# Patient Record
Sex: Male | Born: 1965 | Race: Asian | Hispanic: No | Marital: Married | Smoking: Never smoker
Health system: Southern US, Community
[De-identification: ages and names within clinical notes are randomized; demographics above are authoritative.]

## PROBLEM LIST (undated history)

## (undated) DIAGNOSIS — F32A Depression, unspecified: Secondary | ICD-10-CM

## (undated) DIAGNOSIS — I10 Essential (primary) hypertension: Secondary | ICD-10-CM

## (undated) HISTORY — DX: Essential (primary) hypertension: I10

## (undated) HISTORY — DX: Depression, unspecified: F32.A

---

## 2010-07-26 ENCOUNTER — Emergency Department (HOSPITAL_COMMUNITY)
Admission: EM | Admit: 2010-07-26 | Discharge: 2010-07-26 | Disposition: A | Discharge status: Home / Self Care | Payer: Medicaid Other | Attending: Emergency Medicine | Admitting: Emergency Medicine

## 2010-07-26 DIAGNOSIS — R51 Headache: Secondary | ICD-10-CM | POA: Insufficient documentation

## 2010-07-26 DIAGNOSIS — R509 Fever, unspecified: Secondary | ICD-10-CM | POA: Insufficient documentation

## 2010-07-26 DIAGNOSIS — J039 Acute tonsillitis, unspecified: Secondary | ICD-10-CM | POA: Insufficient documentation

## 2010-07-26 LAB — URINE MICROSCOPIC-ADD ON

## 2010-07-26 LAB — URINALYSIS, ROUTINE W REFLEX MICROSCOPIC
Bilirubin Urine: NEGATIVE
Bilirubin Urine: NEGATIVE
Glucose, UA: NEGATIVE mg/dL
Ketones, ur: NEGATIVE mg/dL
Nitrite: NEGATIVE
Specific Gravity, Urine: 1.007 (ref 1.005–1.030)
Specific Gravity, Urine: 1.006 (ref 1.005–1.030)
pH: 7 (ref 5.0–8.0)
pH: 6.5 (ref 5.0–8.0)

## 2012-10-06 ENCOUNTER — Encounter (HOSPITAL_COMMUNITY): Payer: Self-pay | Admitting: *Deleted

## 2012-10-06 ENCOUNTER — Emergency Department (HOSPITAL_COMMUNITY)
Admission: EM | Admit: 2012-10-06 | Discharge: 2012-10-06 | Disposition: A | Discharge status: Home / Self Care | Payer: 59 | Attending: Emergency Medicine | Admitting: Emergency Medicine

## 2012-10-06 DIAGNOSIS — R5381 Other malaise: Secondary | ICD-10-CM | POA: Insufficient documentation

## 2012-10-06 DIAGNOSIS — R51 Headache: Secondary | ICD-10-CM | POA: Insufficient documentation

## 2012-10-06 DIAGNOSIS — R519 Headache, unspecified: Secondary | ICD-10-CM

## 2012-10-06 DIAGNOSIS — R5383 Other fatigue: Secondary | ICD-10-CM | POA: Insufficient documentation

## 2012-10-06 DIAGNOSIS — R509 Fever, unspecified: Secondary | ICD-10-CM | POA: Insufficient documentation

## 2012-10-06 MED ORDER — ACETAMINOPHEN 500 MG PO TABS
1000.0000 mg | ORAL_TABLET | Freq: Once | ORAL | Status: AC
  Administered 2012-10-06: 1000 mg via ORAL
  Filled 2012-10-06: qty 2

## 2012-10-06 NOTE — ED Notes (Addendum)
Pt states he was at work and HA started slowly. States he also felt hot with fever. No meds taken. HA remains. No vomiting. States he feels very tired. Pt acting appropriately. Laughing at times, but appears to be in pain

## 2012-10-06 NOTE — ED Provider Notes (Signed)
CSN: [id number]     Arrival date & time 10/06/12  1725 History     First MD Initiated Contact with Patient 10/06/12 2126     Chief Complaint  Patient presents with  . Headache  . Fever   (Consider location/radiation/quality/duration/timing/severity/associated sxs/prior Treatment) HPI Comments: Patient reports he began having a headache around 1pm today while he was at work.  States the pain gradually got worse over a period of 5 hours.  Pain located across forehead, throbbing.  States he felt hot and his body felt weak while he was having the headache.  The headache is almost gone now and he has taken no medication.  Denies fevers, chills, body aches, recent illness.  Denies dizziness, lightheadedness, visual changes, focal neurological deficits.   Patient is a 47 y.o. male presenting with headaches and fever. The history is provided by the patient. The history is limited by a language barrier. A language interpreter was used.  Headache Associated symptoms: no abdominal pain, no cough, no diarrhea, no dizziness, no fever, no nausea, no numbness and no vomiting   Fever Associated symptoms: headaches   Associated symptoms: no chest pain, no chills, no cough, no diarrhea, no nausea and no vomiting     History reviewed. No pertinent past medical history. History reviewed. No pertinent past surgical history. History reviewed. No pertinent family history. History  Substance Use Topics  . Smoking status: Never Smoker   . Smokeless tobacco: Not on file  . Alcohol Use: Not on file    Review of Systems  Constitutional: Negative for fever and chills.  Respiratory: Negative for cough and shortness of breath.   Cardiovascular: Negative for chest pain.  Gastrointestinal: Negative for nausea, vomiting, abdominal pain and diarrhea.  Neurological: Positive for headaches. Negative for dizziness, light-headedness and numbness.    Allergies  Review of patient's allergies indicates no known  allergies.  Home Medications  No current outpatient prescriptions on file. BP 129/93  Pulse 68  Temp(Src) 97.5 F (36.4 C) (Oral)  Resp 20  SpO2 100% Physical Exam  Nursing note and vitals reviewed. Constitutional: He appears well-developed and well-nourished. No distress.  HENT:  Head: Normocephalic and atraumatic.  Neck: Normal range of motion. Neck supple.  Cardiovascular: Normal rate and regular rhythm.   Pulmonary/Chest: Effort normal and breath sounds normal. No respiratory distress. He has no wheezes. He has no rales.  Abdominal: Soft. He exhibits no distension and no mass. There is no tenderness. There is no rebound and no guarding.  Neurological: He is alert. He has normal strength. No cranial nerve deficit or sensory deficit. He exhibits normal muscle tone. Gait normal. GCS eye subscore is 4. GCS verbal subscore is 5. GCS motor subscore is 6.  CN II-XII intact, EOMs intact, no pronator drift, grip strengths equal bilaterally; strength 5/5 in all extremities, sensation intact in all extremities; finger to nose, heel to shin, rapid alternating movements normal; gait is normal.     Skin: He is not diaphoretic.    ED Course   Procedures (including critical care time)  Labs Reviewed - No data to display No results found.  1. Headache     MDM  Pt with frontal headache today that was gradual in onset over 5 hours, no neuro deficits. Pain has improved without any medications, pt stating it was "almost gone" when I first saw him.  No meningeal signs.  No red flags for headache.  Pt given tylenol here, advised to take same or ibuprofen at  home.  Has PCP follow up with [facility].  Discussed all results with patient.  Pt given return precautions.  Pt verbalizes understanding and agrees with plan.       Trixie Dredge, PA-C 10/06/12 2307

## 2012-10-09 NOTE — ED Provider Notes (Signed)
Medical screening examination/treatment/procedure(s) were performed by non-physician practitioner and as supervising physician I was immediately available for consultation/collaboration.   Lyanne Co, MD 10/09/12 [phone]

## 2013-04-16 ENCOUNTER — Encounter (HOSPITAL_COMMUNITY): Payer: Self-pay | Admitting: Emergency Medicine

## 2013-04-16 ENCOUNTER — Emergency Department (HOSPITAL_COMMUNITY)
Admission: EM | Admit: 2013-04-16 | Discharge: 2013-04-16 | Disposition: A | Discharge status: Home / Self Care | Payer: 59 | Attending: Emergency Medicine | Admitting: Emergency Medicine

## 2013-04-16 DIAGNOSIS — L309 Dermatitis, unspecified: Secondary | ICD-10-CM

## 2013-04-16 DIAGNOSIS — IMO0002 Reserved for concepts with insufficient information to code with codable children: Secondary | ICD-10-CM | POA: Insufficient documentation

## 2013-04-16 DIAGNOSIS — L259 Unspecified contact dermatitis, unspecified cause: Secondary | ICD-10-CM | POA: Insufficient documentation

## 2013-04-16 MED ORDER — HYDROCORTISONE 1 % EX CREA
1.0000 | TOPICAL_CREAM | Freq: Two times a day (BID) | CUTANEOUS | Status: DC
Start: 2013-04-16 — End: 2014-02-11

## 2013-04-16 MED ORDER — PREDNISONE 20 MG PO TABS: 40.0000 mg | ORAL_TABLET | Freq: Every day | ORAL | Status: DC

## 2013-04-16 NOTE — Discharge Instructions (Signed)
????????? ?? ?????? ??????????. ????? ???? ????? ?? ???? ???????? ?? ?? ???? ???  Use the cream prescribed. Call a dermatologist if symptoms persist or worsen.  ??????? ??? atopic ????? ?????? ??????? , ???? ??????? ???????????? ?? ???? ????? ?? ? ?? ?? ??? ???? ? ?????? , scaly ???? ?????? ? ???? ???? ????? ????? ? ??????? ???? ???? ??????? ?????????? ???? ????? ? ????? ????? ?????? ??? ????? ? ??????? ?????????? ???????????? ?? ???????? ?????? ???? ?????? ???? ??????????? ??????? outgrow , ?? ?? ????? ?????? ??????? ???? ? ??????????? ??????? ???????? ???? ???? ? , ?? ?? ?????? ????? ??????? ? ??????? ??? ?????? ????? ??????? , ?????? , ?? , ?? ???? ????? ?? ?? ?????? ?????? ? ? ??????? ????? ??? ? ?????? ??? - ?? ???? ??? ???? : ???? ??? ?????? ????? ????????? ?? ?????? ? ???? ? ????? ? ????? ????? , scaly ???? ? ??? , ????? ???? ? Itchiness ? ?? ???? ???? ??? ??? ???? ? ???? ????? ??? ???? ? ????? ??????? ?? ????? ?????????? ????? ? ???????? ?????? ?? ?????? ????? ?? ????? ??????? ???? ??? ??????, ?? ????? ?????????? ????? ? ???? ?????? ????????? ???? ??????? ?? ????? ????? ?????? ??? ????: ????? ????????? ? scratching? ????? ???? ????????? ????? ?????-the-??????? antihistamines ?????? ?????????? ????? ???? ??? ????????? ?? ?? ??? ?? ????? ??? ?????? ?? ????? ???? ????????? ????? ?????-the-??????? ?????????? ????? ?????? ?????????? Scratching ????? Scratching ???? ? ??? ????? ??????? ?? ??? ???? scratching ???? ???? ?? ?? ????? ?? ???? ??????? (impetigo) ??? ????? ?????? ???? ??? ????? ??? moisturized ???? ???????? ?? ??????? ?? ??? ? ???????? ????? ??? ??????? ????????? ???? ?????? ???? ?????? ????? ? ???? ?????? ???? ???? ?????????? ????? (??????) ????????? ?? ?????? ???? ???? ????? ?????? ???? ?? ???? ????????? ??????? ???? ?????????? ????? ?????? ??? ???? ???????? ????? ????????? ???????? ??????? ?????? ????????? ????? ????? ?????-the-??????? ?? ?????? ???? ???? ????? ?????????  ???????? ??????? ??? ???? ???? ???? ?? ???? ?????? ??????????? ?????? (???? ???) ???? ?? ????? ?? ???? ????? (5 ?????) ??????????? ????? ?? ???? ???? cleansers ?????? ?????????? ?? unscented ????????? ????? ????? ???? nonperfumed ????? ??? ???? ??????????? ?? ????? ? ????? ????? ????? ??? ????? ?????? ?? ??????? ?? ????? ?? ????? ???, ???? ??? ??? ????? ? ??, ???????? ???? ?? ???????????? ??? ????? ?? ??? ?? ?????????? ??? ????????? ?? ??????? ?? ??? ? ???????? ????? ??? ??????? ??????, ???? ????? ?? unscented ????????? ????????? ????? ??????????? ??????? ??? ???????? ?? ??? ???? ??? ???? ??? ????? ?? mittens ?????? ?????????? ??? ????? ???? ?? ???? ??????????? ????? ?????? ????? ????? ???? ??????, ???????? ?????? ??????? ??? ?? ????? ????? ???? ?? ???? ?????? ??? ???? ???? ???? ????? ????? ???? (???? Simplex) ????????? ????? ??????? ???????? ??? ?? ?? ?????? ???? ??????? ??? ????? ?????? ???? ??? ??????????: ??????? ????? ???????? ??? ?????????? ??????? ???? ???? ????? ?? ????? ???? ??? ??? 1 ????? ????? ????? ?? ????? ???? ??????? ?? ???? ?? ??? ?????? scabs ??????????? ????? ?? ????? ?? ????? ????? ???? ? ?????? ??????? ??? ?? ???? ?????-???? ??   Eczema Eczema, also called atopic dermatitis, is a skin disorder that causes inflammation of the skin. It causes a red rash and dry, scaly skin. The skin becomes very itchy. Eczema is generally worse during the cooler winter months and often improves with the warmth of summer. Eczema usually starts showing signs in infancy. Some children outgrow eczema, but it may last through adulthood.  CAUSES  The exact cause  of eczema is not known, but it appears to run in families. People with eczema often have a family history of eczema, allergies, asthma, or hay fever. Eczema is not contagious. Flare-ups of the condition may be caused by:   Contact with something you are sensitive or allergic to.   Stress. SIGNS AND SYMPTOMS  Dry, scaly skin.   Red,  itchy rash.   Itchiness. This may occur before the skin rash and may be very intense.  DIAGNOSIS  The diagnosis of eczema is usually made based on symptoms and medical history. TREATMENT  Eczema cannot be cured, but symptoms usually can be controlled with treatment and other strategies. A treatment plan might include:  Controlling the itching and scratching.   Use over-the-counter antihistamines as directed for itching. This is especially useful at night when the itching tends to be worse.   Use over-the-counter steroid creams as directed for itching.   Avoid scratching. Scratching makes the rash and itching worse. It may also result in a skin infection (impetigo) due to a break in the skin caused by scratching.   Keeping the skin well moisturized with creams every day. This will seal in moisture and help prevent dryness. Lotions that contain alcohol and water should be avoided because they can dry the skin.   Limiting exposure to things that you are sensitive or allergic to (allergens).   Recognizing situations that cause stress.   Developing a plan to manage stress.  HOME CARE INSTRUCTIONS   Only take over-the-counter or prescription medicines as directed by your health care provider.   Do not use anything on the skin without checking with your health care provider.   Keep baths or showers short (5 minutes) in warm (not hot) water. Use mild cleansers for bathing. These should be unscented. You may add nonperfumed bath oil to the bath water. It is best to avoid soap and bubble bath.   Immediately after a bath or shower, when the skin is still damp, apply a moisturizing ointment to the entire body. This ointment should be a petroleum ointment. This will seal in moisture and help prevent dryness. The thicker the ointment, the better. These should be unscented.   Keep fingernails cut short. Children with eczema may need to wear soft gloves or mittens at night after  applying an ointment.   Dress in clothes made of cotton or cotton blends. Dress lightly, because heat increases itching.   A child with eczema should stay away from anyone with fever blisters or cold sores. The virus that causes fever blisters (herpes simplex) can cause a serious skin infection in children with eczema. SEEK MEDICAL CARE IF:   Your itching interferes with sleep.   Your rash gets worse or is not better within 1 week after starting treatment.   You see pus or soft yellow scabs in the rash area.   You have a fever.   You have a rash flare-up after contact with someone who has fever blisters.  Document Released: 02/07/2000 Document Revised: 11/30/2012 Document Reviewed: 09/12/2012 [facility] Patient Information 2014 [place], [place].

## 2013-04-16 NOTE — ED Provider Notes (Signed)
CSN: [id number]     Arrival date & time 04/16/13  1355 History  This chart was scribed for non-physician practitioner, Antonietta Breach, PA-C working with Blanchard Kelch, MD by Frederich Balding, ED scribe. This patient was seen in room TR05C/TR05C and the patient's care was started at 2:23 PM.   Chief Complaint  Patient presents with  . Allergic Reaction   The history is provided by the patient. A language interpreter was used.   HPI Comments: Cody Ross is a 48 y.o. male who presents to the Emergency Department complaining of an allergic reaction that started one week ago. He has had an itchy rash that started on his face and moved down to his neck. Pt is unsure what triggered the reaction. He has taken benadryl with little relief. Pt denies any new foods, medications, soaps or detergents. Denies fever, inability to swallowing, drooling, trouble breathing, SOB, emesis. Pt denies recent travel outside of the country.   History reviewed. No pertinent past medical history. History reviewed. No pertinent past surgical history. No family history on file. History  Substance Use Topics  . Smoking status: Never Smoker   . Smokeless tobacco: Not on file  . Alcohol Use: Not on file    Review of Systems  Constitutional: Negative for fever.  HENT: Negative for drooling and trouble swallowing.   Respiratory: Negative for shortness of breath.   Gastrointestinal: Negative for vomiting.  Skin: Positive for rash.  All other systems reviewed and are negative.   Allergies  Review of patient's allergies indicates no known allergies.  Home Medications   Current Outpatient Rx  Name  Route  Sig  Dispense  Refill  . hydrocortisone cream 1 %   Topical   Apply 1 application topically 2 (two) times daily.   15 g   1   . predniSONE (DELTASONE) 20 MG tablet   Oral   Take 2 tablets (40 mg total) by mouth daily.   10 tablet   0     BP 137/95  Pulse 76  Temp(Src) 97.7 F (36.5 C) (Oral)  Resp  16  SpO2 99%  Physical Exam  Nursing note and vitals reviewed. Constitutional: He is oriented to person, place, and time. He appears well-developed and well-nourished. No distress.  HENT:  Head: Normocephalic and atraumatic.  Mouth/Throat: Oropharynx is clear and moist. No oropharyngeal exudate.  Oropharynx clear. Patient tolerating secretions without difficulty or drooling. No oral lesions.  Eyes: Conjunctivae and EOM are normal. No scleral icterus.  Neck: Normal range of motion. Neck supple.  No stridor.  Cardiovascular: Normal rate, regular rhythm and normal heart sounds.   Pulmonary/Chest: Effort normal and breath sounds normal. No stridor. No respiratory distress. He has no wheezes. He has no rales.  Musculoskeletal: Normal range of motion.  Neurological: He is alert and oriented to person, place, and time.  GCS 15. Speech is goal oriented. Patient moves extremities without ataxia.  Skin: Skin is warm and dry. Rash noted. He is not diaphoretic. No erythema. No pallor.  Raised, pruritic, erythematous macular rash appreciated to bilateral cheeks and forehead as well as patient's neck. Rash is dry without scaling or plaques. No skin peeling, vesicles, pustules, or bullae. No weeping or drainage.  Psychiatric: He has a normal mood and affect. His behavior is normal.    ED Course  Procedures (including critical care time)  DIAGNOSTIC STUDIES: Oxygen Saturation is 99% on RA, normal by my interpretation.    COORDINATION OF CARE: 2:36 PM-Discussed treatment  plan which includes a steroid cream with pt at bedside and pt agreed to plan. Advised pt to follow up with dermatology.   Labs Review Labs Reviewed - No data to display Imaging Review No results found.  EKG Interpretation   None       MDM   Final diagnoses:  Eczema   Uncomplicated eczematous rash. Patient well and nontoxic appearing, hemodynamically stable, and afebrile. No angioedema appreciated. Patient speaks in  full goal oriented sentences. He tolerates his secretions without difficulty or drooling. Rash non-concerning for SJS, erythema multiforme major, and erythema multiforme minor; patient denies the use of antibiotics or new medications prior to rash onset. Have consulted with pharmacist who recommends hydrocortisone used topically on face. Will also prescribe oral steroids for symptoms. Have advised frequent moisturization. Dermatology referral provided as well as return precautions. These have all been discussed with the patient using a language line interpreter. Patient agreeable to plan with no unaddressed concerns.  I personally performed the services described in this documentation, which was scribed in my presence. The recorded information has been reviewed and is accurate.   Filed Vitals:   04/16/13 1402  BP: 137/95  Pulse: 76  Temp: 97.7 F (36.5 C)  TempSrc: Oral  Resp: 16  SpO2: 99%     Antonietta Breach, PA-C 04/16/13 1608

## 2013-04-16 NOTE — ED Notes (Signed)
Friend in room translating for pt.  Speaks Nepali.

## 2013-04-16 NOTE — ED Notes (Signed)
Pt. Has a rash around face and neck. Denies any problem swallowing.

## 2013-04-16 NOTE — ED Notes (Signed)
Translator stated, allergic reaction for one week on the face.

## 2013-04-17 NOTE — ED Provider Notes (Signed)
Medical screening examination/treatment/procedure(s) were conducted as a shared visit with non-physician practitioner(s) and myself.  I personally evaluated the patient during the encounter.  EKG Interpretation   None      I interviewed and examined the patient. Lungs are CTAB. Cardiac exam wnl. Abdomen soft.  Mild erythematous macular/papular rash on cheeks, foreheads, right lateral neck. Pt notes it is pruritic. Does not appear infectious in nature, no weeping, drainage, warmth. Doubt malar rash assoc w/ lupus given it is pruritic. Likely eczematous vs contact dermatitis.    Junius ArgyleForrest S Seleena Reimers, MD 04/17/13 1209

## 2013-11-09 ENCOUNTER — Encounter (HOSPITAL_COMMUNITY): Payer: Self-pay | Admitting: Emergency Medicine

## 2013-11-09 ENCOUNTER — Emergency Department (HOSPITAL_COMMUNITY)
Admission: EM | Admit: 2013-11-09 | Discharge: 2013-11-09 | Disposition: A | Discharge status: Home / Self Care | Payer: 59 | Attending: Emergency Medicine | Admitting: Emergency Medicine

## 2013-11-09 ENCOUNTER — Emergency Department (HOSPITAL_COMMUNITY): Payer: 59

## 2013-11-09 DIAGNOSIS — IMO0002 Reserved for concepts with insufficient information to code with codable children: Secondary | ICD-10-CM | POA: Diagnosis not present

## 2013-11-09 DIAGNOSIS — Y9289 Other specified places as the place of occurrence of the external cause: Secondary | ICD-10-CM | POA: Diagnosis not present

## 2013-11-09 DIAGNOSIS — Y990 Civilian activity done for income or pay: Secondary | ICD-10-CM | POA: Diagnosis not present

## 2013-11-09 DIAGNOSIS — S99919A Unspecified injury of unspecified ankle, initial encounter: Principal | ICD-10-CM

## 2013-11-09 DIAGNOSIS — Y9389 Activity, other specified: Secondary | ICD-10-CM | POA: Diagnosis not present

## 2013-11-09 DIAGNOSIS — S8990XA Unspecified injury of unspecified lower leg, initial encounter: Secondary | ICD-10-CM | POA: Diagnosis not present

## 2013-11-09 DIAGNOSIS — S99929A Unspecified injury of unspecified foot, initial encounter: Principal | ICD-10-CM

## 2013-11-09 DIAGNOSIS — M79672 Pain in left foot: Secondary | ICD-10-CM

## 2013-11-09 MED ORDER — OXYCODONE-ACETAMINOPHEN 5-325 MG PO TABS
2.0000 | ORAL_TABLET | Freq: Once | ORAL | Status: AC
  Administered 2013-11-09: 2 via ORAL
  Filled 2013-11-09: qty 2

## 2013-11-09 MED ORDER — INDOMETHACIN 25 MG PO CAPS: 25.0000 mg | ORAL_CAPSULE | Freq: Three times a day (TID) | ORAL | Status: DC | PRN

## 2013-11-09 MED ORDER — HYDROCODONE-ACETAMINOPHEN 5-325 MG PO TABS: 1.0000 | ORAL_TABLET | Freq: Four times a day (QID) | ORAL | Status: DC | PRN

## 2013-11-09 NOTE — ED Notes (Signed)
Pt. injured his left foot at work yesterday ( hit it against a caster wheel ) , presents with left foot pain with mild swelling , ambulatory.

## 2013-11-09 NOTE — Discharge Instructions (Signed)
Recommend you take indomethacin as prescribed. You may also take Norco for pain control. Ice your foot 3-4 times per day. Keep your foot elevated. Follow up with an orthopedist if symptoms persist.  RICE: Routine Care for Injuries The routine care of many injuries includes Rest, Ice, Compression, and Elevation (RICE). HOME CARE INSTRUCTIONS  Rest is needed to allow your body to heal. Routine activities can usually be resumed when comfortable. Injured tendons and bones can take up to 6 weeks to heal. Tendons are the cord-like structures that attach muscle to bone.  Ice following an injury helps keep the swelling down and reduces pain.  Put ice in a plastic bag.  Place a towel between your skin and the bag.  Leave the ice on for 15-20 minutes, 3-4 times a day, or as directed by your health care provider. Do this while awake, for the first 24 to 48 hours. After that, continue as directed by your caregiver.  Compression helps keep swelling down. It also gives support and helps with discomfort. If an elastic bandage has been applied, it should be removed and reapplied every 3 to 4 hours. It should not be applied tightly, but firmly enough to keep swelling down. Watch fingers or toes for swelling, bluish discoloration, coldness, numbness, or excessive pain. If any of these problems occur, remove the bandage and reapply loosely. Contact your caregiver if these problems continue.  Elevation helps reduce swelling and decreases pain. With extremities, such as the arms, hands, legs, and feet, the injured area should be placed near or above the level of the heart, if possible. SEEK IMMEDIATE MEDICAL CARE IF:  You have persistent pain and swelling.  You develop redness, numbness, or unexpected weakness.  Your symptoms are getting worse rather than improving after several days. These symptoms may indicate that further evaluation or further X-rays are needed. Sometimes, X-rays may not show a small broken  bone (fracture) until 1 week or 10 days later. Make a follow-up appointment with your caregiver. Ask when your X-ray results will be ready. Make sure you get your X-ray results. Document Released: 05/24/2000 Document Revised: 02/14/2013 Document Reviewed: 07/11/2010 [facility] Patient Information 2015 [place], [place]. This information is not intended to replace advice given to you by your health care provider. Make sure you discuss any questions you have with your health care provider.

## 2013-11-10 NOTE — ED Provider Notes (Signed)
CSN: [id number]     Arrival date & time 11/09/13  1901 History   First MD Initiated Contact with Patient 11/09/13 2055     Chief Complaint  Patient presents with  . Foot Injury    (Consider location/radiation/quality/duration/timing/severity/associated sxs/prior Treatment) Patient is a 48 y.o. male presenting with foot injury. The history is provided by the patient and a relative. A language interpreter was used (son translating at bedside).  Foot Injury Location:  Foot Time since incident:  1 day Injury: yes   Mechanism of injury comment:  Foot hit against a caster wheel Foot location:  L foot Pain details:    Quality:  Aching and throbbing   Radiates to:  Does not radiate   Severity:  Moderate   Onset quality:  Sudden   Duration:  1 day   Timing:  Constant   Progression:  Unchanged Chronicity:  New Dislocation: no   Prior injury to area:  No Relieved by:  None tried Worsened by:  Bearing weight (and palpation) Ineffective treatments:  None tried Associated symptoms: swelling (mild)   Associated symptoms: no decreased ROM, no fever, no muscle weakness and no numbness   Risk factors: no known bone disorder     History reviewed. No pertinent past medical history. History reviewed. No pertinent past surgical history. No family history on file. History  Substance Use Topics  . Smoking status: Never Smoker   . Smokeless tobacco: Not on file  . Alcohol Use: No    Review of Systems  Constitutional: Negative for fever.  Musculoskeletal: Positive for arthralgias and joint swelling.  Skin: Negative for pallor.  Neurological: Negative for weakness and numbness.  All other systems reviewed and are negative.   Allergies  Review of patient's allergies indicates no known allergies.  Home Medications   Prior to Admission medications   Medication Sig Start Date End Date Taking? Authorizing Provider  HYDROcodone-acetaminophen (NORCO/VICODIN) 5-325 MG per tablet Take 1-2  tablets by mouth every 6 (six) hours as needed for moderate pain or severe pain. 11/09/13   Antony Madura, PA-C  hydrocortisone cream 1 % Apply 1 application topically 2 (two) times daily. 04/16/13   Antony Madura, PA-C  indomethacin (INDOCIN) 25 MG capsule Take 1 capsule (25 mg total) by mouth 3 (three) times daily as needed. 11/09/13   Antony Madura, PA-C  predniSONE (DELTASONE) 20 MG tablet Take 2 tablets (40 mg total) by mouth daily. 04/16/13   Antony Madura, PA-C   BP 120/82  Pulse 80  Temp(Src) 99.8 F (37.7 C) (Oral)  Resp 16  Ht  (1.626 m)  Wt 137 lb (62.143 kg)  BMI 23.50 kg/m2  SpO2 99%  Physical Exam  Nursing note and vitals reviewed. Constitutional: He is oriented to person, place, and time. He appears well-developed and well-nourished. No distress.  HENT:  Head: Normocephalic and atraumatic.  Eyes: Conjunctivae and EOM are normal. No scleral icterus.  Neck: Normal range of motion.  Cardiovascular: Normal rate, regular rhythm and intact distal pulses.   DP and PT pulses 2+ in left lower extremity.  Pulmonary/Chest: Effort normal. No respiratory distress.  Musculoskeletal: Normal range of motion. He exhibits tenderness.       Left foot: He exhibits tenderness, bony tenderness and swelling. He exhibits normal range of motion, no crepitus and no deformity.       Feet:  Neurological: He is alert and oriented to person, place, and time. He exhibits normal muscle tone. Coordination normal.  Sensation to light  touch intact. Patient able to wiggle all toes.  Skin: Skin is warm and dry. No rash noted. He is not diaphoretic. No erythema. No pallor.  Psychiatric: He has a normal mood and affect. His behavior is normal.    ED Course  Procedures (including critical care time) Labs Review Labs Reviewed - No data to display  Imaging Review Dg Foot Complete Left  11/09/2013   CLINICAL DATA:  Left foot view. Pain is worse with bearing weight. A cart hit the foot.  EXAM: LEFT FOOT -  COMPLETE 3+ VIEW  COMPARISON:  None.  FINDINGS: There is no evidence of fracture or dislocation. There is no evidence of arthropathy or other focal bone abnormality. Soft tissues are unremarkable.  IMPRESSION: Negative.   Electronically Signed   By: Rosalie Gums M.D.   On: 11/09/2013 21:42     EKG Interpretation None      MDM   Final diagnoses:  Left foot pain    47 year old male presents to the emergency department for left foot pain after sustaining an injury yesterday. Patient hit his foot against a caster wheel. No medications taken prior to arrival. Patient neurovascularly intact on exam. Tenderness is localized to the first MTP joint of the left foot. There is mild associated swelling. No crepitus, deformity, or effusion. Imaging today is negative for fracture or dislocation. No bony deformity. Patient given postop shoe for comfort. He is stable for discharge with prescription for NSAIDs and Norco for breakthrough pain. Return precautions discussed and provided. Patient agreeable to plan with no unaddressed concerns.   Filed Vitals:   11/09/13 1918 11/09/13 2210  BP: 120/84 120/82  Pulse: 82 80  Temp: 99.8 F (37.7 C)   TempSrc: Oral   Resp: 14 16  Height:  (1.626 m)   Weight: 137 lb (62.143 kg)   SpO2: 98% 99%      Antony Madura, PA-C 11/10/13 [phone]

## 2013-11-11 NOTE — ED Provider Notes (Signed)
Medical screening examination/treatment/procedure(s) were performed by non-physician practitioner and as supervising physician I was immediately available for consultation/collaboration.   EKG Interpretation None        Jahid Weida N Diana Davenport, DO 11/11/13 1709 

## 2014-02-11 ENCOUNTER — Encounter (HOSPITAL_COMMUNITY): Payer: Self-pay | Admitting: Nurse Practitioner

## 2014-02-11 ENCOUNTER — Emergency Department (HOSPITAL_COMMUNITY)
Admission: EM | Admit: 2014-02-11 | Discharge: 2014-02-11 | Disposition: A | Discharge status: Home / Self Care | Payer: 59 | Attending: Emergency Medicine | Admitting: Emergency Medicine

## 2014-02-11 DIAGNOSIS — R21 Rash and other nonspecific skin eruption: Secondary | ICD-10-CM | POA: Insufficient documentation

## 2014-02-11 DIAGNOSIS — Z79899 Other long term (current) drug therapy: Secondary | ICD-10-CM | POA: Diagnosis not present

## 2014-02-11 DIAGNOSIS — Z7952 Long term (current) use of systemic steroids: Secondary | ICD-10-CM | POA: Insufficient documentation

## 2014-02-11 MED ORDER — DIPHENHYDRAMINE HCL 25 MG PO TABS: 25.0000 mg | ORAL_TABLET | Freq: Four times a day (QID) | ORAL | Status: DC

## 2014-02-11 MED ORDER — HYDROCORTISONE 1 % EX CREA: 1.0000 "application " | TOPICAL_CREAM | Freq: Two times a day (BID) | CUTANEOUS | Status: DC

## 2014-02-11 NOTE — ED Notes (Signed)
PA at bedside.

## 2014-02-11 NOTE — Discharge Instructions (Signed)
Please take your hydrocortisone cream as directed. He may take Benadryl for her allergies and any itching he may experience. Please follow-up with primary care for further evaluation and management of your symptoms. Return to ED for worsening symptoms, fevers, redness, skin peeling   Emergency Department Resource Guide 1) Find a Doctor and Pay Out of Pocket Although you won't have to find out who is covered by your insurance plan, it is a good idea to ask around and get recommendations. You will then need to call the office and see if the doctor you have chosen will accept you as a new patient and what types of options they offer for patients who are self-pay. Some doctors offer discounts or will set up payment plans for their patients who do not have insurance, but you will need to ask so you aren't surprised when you get to your appointment.  2) Contact Your Local Health Department Not all health departments have doctors that can see patients for sick visits, but many do, so it is worth a call to see if yours does. If you don't know where your local health department is, you can check in your phone book. The CDC also has a tool to help you locate your state's health department, and many state websites also have listings of all of their local health departments.  3) Find a Walk-in Clinic If your illness is not likely to be very severe or complicated, you may want to try a walk in clinic. These are popping up all over the country in pharmacies, drugstores, and shopping centers. They're usually staffed by nurse practitioners or physician assistants that have been trained to treat common illnesses and complaints. They're usually fairly quick and inexpensive. However, if you have serious medical issues or chronic medical problems, these are probably not your best option.  No Primary Care Doctor: - Call Health Connect at  [phone] - they can help you locate a primary care doctor that  accepts your  insurance, provides certain services, etc. - Physician Referral Service- [phone]  Chronic Pain Problems: Organization         Address  Phone   Notes  [facility]  [phone] Patients need to be referred by their primary care doctor.   Medication Assistance: Organization         Address  Phone   Notes  [facility] [address] [place], [place] [address] [phone]) [phone] --Must be a resident of [place] [facility] -- Must have NO insurance coverage whatsoever (no Medicaid/ Medicare, etc.) -- The pt. MUST have a primary care doctor that directs their care regularly and follows them in the community   MedAssist  [phone]   [employer]  [phone]    Agencies that provide inexpensive medical care: Organization         Address  Phone   Notes  [facility]  [phone]   [facility]    [phone]   [facility] [address] [place], [place] [address] [phone]   [facility] [address], [place] [phone]) [phone]   [facility]anned [facility]    [phone](336) [phone]   [facility]    [phone]   [facility]  [address], [place] Phone:  [phone]) [phone], Fax:  [phone] Hours of Operation:  9 am - 6 pm, M-F.  Also  accepts Medicaid/Medicare and self-pay.  [facility] for [place], [address], [place] Phone: [phone], Fax: [phone]. Hours of Operation:  8:30 am - 5:30 pm, M-F.  Also accepts Medicaid and self-pay.  [facility] [place] [address] [place] Phone: [phone]   [place], [place], [place] [phone], Ext. [phone] Mondays & Thursdays: 7-9 AM.  First 15 patients are seen on a first come, first serve basis.    [place] Providers:  Organization          Address  Phone   Notes  [facility] [address], [place] [phone] Also accepts self-pay patients.  [facility] [id number] [place], [place]  [phone]   [place], [address], [place] [phone]   [facility] [address], [place] [phone]   Lucianne Lei [address], [place]   [phone] Only accepts [place] Access [place] patients after they have their name applied to their card.   Self-Pay (no insurance) in [facility]:  Organization         Address  Phone   Notes  Sickle Cell Patients, [facility] [place] [phone]   [facility] [place] [phone]   [facility] [place]  [place], [place], [place] [phone]   [facility]/Dr. Osei-Bonsu  [address] [place] or [address], [place] [phone] Phone number for both [place] and [place] locations is the same.  [facility] [address] [place] [phone]   [facility] [address], [place] or [address] [phone]   [facility] [address] [place] [phone], phone; [phone], fax Sees patients 1st and 3rd Saturday of every month.  Must not qualify for public or private insurance (i.e. Medicaid, Medicare, [place] Health Choice, Veterans' Benefits)  Household income should be no more than 200% of the poverty level The clinic cannot treat you if you are pregnant or think you are pregnant  Sexually transmitted diseases are not treated at the clinic.    Dental Care: Organization         Address  Phone  Notes  [facility] Department of [facility] [phone] Accepts children up to age 12 who are enrolled in [place] or [place]; pregnant women with a Medicaid card; and children who have applied for Medicaid or [place] Health Choice, but were declined, whose parents can pay a reduced fee at time of service.  [facility] of [facility]  [address], [place] [phone] Accepts children up to age 44 who are enrolled in [place] or [place]; pregnant women with a Medicaid card; and children who have applied for Medicaid or [place] Health Choice, but were declined, whose parents can pay a reduced fee at time of service.  [facility] Access PROGRAM  [place] [phone] Patients are seen by appointment only. Walk-ins are not accepted. [place] will see patients 60 years of age and older. Monday - Tuesday (8am-5pm)  Most Wednesdays (8:30-5pm) $30 per visit, cash only  [facility] Adult [employer] PROGRAM  [address], [facility] [phone] Patients are seen by appointment only. Walk-ins are not accepted. [place] will see patients 54 years of age and older. One Wednesday Evening (Monthly: Volunteer Based).  $30 per visit, cash only  [place]  [phone] for adults; Children under age 44, call Graduate Pediatric Dentistry at [phone]. Children aged 70-14, please call [phone] to request a pediatric application.  Dental services are provided in all areas of dental care including fillings, crowns and bridges, complete and partial dentures, implants, gum treatment, root canals, and extractions. Preventive care is also provided. Treatment is provided to both adults and children. Patients are selected via a lottery and there is often a waiting list.   [facility] [address] [place]  [phone] [email or web address]   [facility] [address] [place], [place]  [phone], Ext. [phone] Second and Fourth Thursday of each month, opens at 6:30 AM; Clinic ends at 9 AM.  Patients are seen on a first-come first-served basis, and a limited number are seen during each clinic.   [facility]  [address] Hillard Danker [place], [place] [phone]   Eligibility Requirements You must have lived in [place], [place], or [place] counties for at least the last three months.   You cannot be eligible for state or federal sponsored [employer], including [employer], [place], or [employer].   You generally cannot be eligible for healthcare insurance through your employer.    How to apply: Eligibility screenings are held every Tuesday and Wednesday afternoon from 1:00 pm until 4:00 pm. You do not need an appointment for the interview!  [facility] [address], [place], [place]   [place]  [place] Department  [place]  [phone]    Behavioral Health Resources in the Community: Intensive Outpatient Programs Organization         Address  Phone  Notes  [place]. [address], [place], [place] [phone]   [facility] Outpatient [address] [place], [place]   ADS: Alcohol & Drug Svcs [address], [place], [place]   [place] [address],  [place], [place] or [phone]   Substance Abuse Resources Organization         Address  Phone  Notes  Alcohol and Drug Services  [phone]   [place]  [phone]   [facility] [place]   Chinita Pester  [phone]   Residential & Outpatient Substance Abuse Program  [phone]   Psychological Services Organization         Address  Phone  Notes  [facility]  [place]  [phone]    [place] [address] [place] or [phone]    Mobile Crisis Teams Organization         Address  Phone  Notes  [facility], Mobile Crisis Care Unit  [phone]   [facility]ive Psychotherapeutic Services  [address] [place], [place]   [place] [address], [place] [phone]    Self-Help/Support Groups Organization         Address  Phone  Notes  [facility] of [place] - variety of support groups  [place] Call for more information  Narcotics Anonymous (NA), [facility]aring [facility] [address], [employer] [place]  2 meetings at this location   Special educational needs teacher         Address  Phone  Notes  [facility] Residential Treatment [place],    [place]  [phone]   [facility]  [address], [place] 426834, [place], [place]   [place], [place] [phone] Admissions: 8am-3pm M-F  Incentives Substance [place] [address],    [place], [place] [phone]   [facility] [address] [place], [place], [place]   [facility] [address]  [place], [place]   [facility]ight Programs - Intensive Outpatient [address] Kristeen [address], [place], [place]   [facility] ([place].,  [place], [place] [phone] or [phone]   [facility] Services (RTS) [address] [place], [place] Accepts Medicaid  [facility] [address],  [place] [phone] Substance Abuse/Addiction Treatment   [facility] Organization         Address  Phone  Notes  [facility]  [phone]   Domenic Schwab, PhD [address] Arlis Porta [place], [place]   [phone] or [phone]   [place]  [place], [place] [phone]   [facility] [address], [place], [place] [phone] Insurance/Medicaid/sponsorship through [facility] and Families [address], [address] [place]                                    [place], [place] [phone] [place] [address], [place] [phone]    Dr. Adele Schilder  [phone]   [facility] Dept. 1) [address] [place] 2) [place] 3)  [address], [place] [phone]  [phone]   [place] [phone] or [phone] (After Hours)

## 2014-02-11 NOTE — ED Notes (Signed)
hes had an itchy red rash to face and back since yesterday. Denies any new products of medications. Denies any past history of allergies. Hes tried nothing at home for the rash

## 2014-02-11 NOTE — ED Provider Notes (Signed)
CSN: [id number]     Arrival date & time 02/11/14  1625 History  This chart was scribed for non-physician practitioner, Joycie PeekBenjamin Nancyann Cotterman, PA-C working with Flint MelterElliott L Wentz, MD by Greggory StallionKayla Andersen, ED scribe. This patient was seen in room TR07C/TR07C and the patient's care was started at 5:19 PM.   Chief Complaint  Patient presents with  . Rash   The history is provided by the patient. No language interpreter was used.    HPI Comments: Cody Ross is a 48 y.o. male who presents to the Emergency Department complaining of an itchy, red rash to his face and back that started yesterday. Reports associated facial swelling where the rash is. Rash is itchy and nonpainful. He has not done anything for his symptoms and states nothing makes them better or worse. Denies new soaps, lotions, detergents, fragrances. No one at home has a similar rash. Denies known allergies. Pt states he had a similar rash last year but never followed up with a dermatologist to figure out the cause. Denies fever, eye burning, changes in vision.   History reviewed. No pertinent past medical history. History reviewed. No pertinent past surgical history. History reviewed. No pertinent family history. History  Substance Use Topics  . Smoking status: Never Smoker   . Smokeless tobacco: Not on file  . Alcohol Use: No    Review of Systems  Constitutional: Negative for fever.  HENT: Positive for facial swelling.   Skin: Positive for rash.  All other systems reviewed and are negative.  Allergies  Review of patient's allergies indicates no known allergies.  Home Medications   Prior to Admission medications   Medication Sig Start Date End Date Taking? Authorizing Provider  diphenhydrAMINE (BENADRYL) 25 MG tablet Take 1 tablet (25 mg total) by mouth every 6 (six) hours. 02/11/14   Sharlene MottsBenjamin W Lileigh Fahringer, PA-C  HYDROcodone-acetaminophen (NORCO/VICODIN) 5-325 MG per tablet Take 1-2 tablets by mouth every 6 (six) hours as needed for  moderate pain or severe pain. 11/09/13   Antony MaduraKelly Humes, PA-C  hydrocortisone cream 1 % Apply 1 application topically 2 (two) times daily. 02/11/14   Earle GellBenjamin W Naveed Humphres, PA-C  indomethacin (INDOCIN) 25 MG capsule Take 1 capsule (25 mg total) by mouth 3 (three) times daily as needed. 11/09/13   Antony MaduraKelly Humes, PA-C  predniSONE (DELTASONE) 20 MG tablet Take 2 tablets (40 mg total) by mouth daily. 04/16/13   Antony MaduraKelly Humes, PA-C   BP 134/87 mmHg  Pulse 65  Temp(Src) 98.1 F (36.7 C) (Oral)  SpO2 99%   Physical Exam  Constitutional: He is oriented to person, place, and time. He appears well-developed and well-nourished. No distress.  HENT:  Head: Normocephalic and atraumatic.  Mouth/Throat: Oropharynx is clear and moist. No oropharyngeal exudate.  Eyes: Conjunctivae and EOM are normal. Pupils are equal, round, and reactive to light. Right eye exhibits no discharge. Left eye exhibits no discharge.  Neck: Neck supple. No tracheal deviation present.  Cardiovascular: Normal rate, regular rhythm and normal heart sounds.   Pulmonary/Chest: Effort normal and breath sounds normal. No respiratory distress.  Clear to auscultation bilaterally.  Abdominal: Soft. There is no tenderness.  Musculoskeletal: Normal range of motion.  Neurological: He is alert and oriented to person, place, and time.  Skin: Skin is warm and dry.  Diffuse, mildly erythematous maculopapular rash to forehead and cheeks, and lower left lumbar area. No vesicles, drainage or weeping. Negative [place].  Psychiatric: He has a normal mood and affect. His behavior is normal.  Nursing note  and vitals reviewed.   ED Course  Procedures (including critical care time)  DIAGNOSTIC STUDIES: Oxygen Saturation is 99% on RA, normal by my interpretation.    COORDINATION OF CARE: 5:30 PM-Discussed treatment plan which includes benadryl and a topical cream with pt at bedside and pt agreed to plan. Will give pt dermatology referrals and advised him to  follow up.   Labs Review Labs Reviewed - No data to display  Imaging Review No results found.   EKG Interpretation None      MDM  Cody MayKumar Borchard is a 48 y.o. male here for evaluation of one day history of rash to face and lower back. History of present illness is given via interpreter as patient speaks [place]. Rash appears eczematous in nature. Rash is pruritic and is nonpainful Was treated for same rash last year with hydrocortisone cream which resolved rash.  Vitals stable - WNL -afebrile Pt resting comfortably in ED. PE--eczematous rash to forehead, bilateral cheeks and lower left lumbar area. Rash does not follow dermatomal pattern  DDX--low concern for SJS, TEN, NF, zoster or other acute emergent skin manifestation. Low concern for zoster ophthalmicus Will DC with hydrocortisone cream to be applied topically. Resource guide provided so patient may follow-up with primary care. Also given Benadryl for associated eye itching. Discussed f/u with PCP and return precautions, pt very amenable to plan. Pt stable, in good condition and is appropriate for discharge,   Final diagnoses:  Rash      I personally performed the services described in this documentation, which was scribed in my presence. The recorded information has been reviewed and is accurate.  Earle GellBenjamin W Randlettartner, PA-C 02/11/14 1806  Flint MelterElliott L Wentz, MD 02/11/14 [id number]

## 2015-06-01 ENCOUNTER — Emergency Department (HOSPITAL_COMMUNITY)
Admission: EM | Admit: 2015-06-01 | Discharge: 2015-06-01 | Disposition: A | Discharge status: Home / Self Care | Payer: 59 | Attending: Emergency Medicine | Admitting: Emergency Medicine

## 2015-06-01 DIAGNOSIS — R21 Rash and other nonspecific skin eruption: Secondary | ICD-10-CM | POA: Diagnosis present

## 2015-06-01 DIAGNOSIS — L509 Urticaria, unspecified: Secondary | ICD-10-CM | POA: Diagnosis not present

## 2015-06-01 LAB — CBC WITH DIFFERENTIAL/PLATELET
BASOS ABS: 0.1 10*3/uL (ref 0.0–0.1)
BASOS PCT: 1 %
EOS ABS: 0.5 10*3/uL (ref 0.0–0.7)
Eosinophils Relative: 7 %
HCT: 40.4 % (ref 39.0–52.0)
Hemoglobin: 14.2 g/dL (ref 13.0–17.0)
LYMPHS PCT: 17 %
Lymphs Abs: 1.3 10*3/uL (ref 0.7–4.0)
MCH: 29.4 pg (ref 26.0–34.0)
MCHC: 35.1 g/dL (ref 30.0–36.0)
MCV: 83.6 fL (ref 78.0–100.0)
MONO ABS: 0.7 10*3/uL (ref 0.1–1.0)
Monocytes Relative: 9 %
NEUTROS ABS: 5.1 10*3/uL (ref 1.7–7.7)
Neutrophils Relative %: 66 %
PLATELETS: 196 10*3/uL (ref 150–400)
RBC: 4.83 MIL/uL (ref 4.22–5.81)
RDW: 12.8 % (ref 11.5–15.5)
WBC: 7.7 10*3/uL (ref 4.0–10.5)

## 2015-06-01 LAB — BASIC METABOLIC PANEL
Anion gap: 9 (ref 5–15)
BUN: 7 mg/dL (ref 6–20)
CALCIUM: 8.8 mg/dL — AB (ref 8.9–10.3)
CO2: 20 mmol/L — ABNORMAL LOW (ref 22–32)
CREATININE: 0.76 mg/dL (ref 0.61–1.24)
Chloride: 111 mmol/L (ref 101–111)
Glucose, Bld: 102 mg/dL — ABNORMAL HIGH (ref 65–99)
Potassium: 3.6 mmol/L (ref 3.5–5.1)
SODIUM: 140 mmol/L (ref 135–145)

## 2015-06-01 MED ORDER — FAMOTIDINE IN NACL 20-0.9 MG/50ML-% IV SOLN
20.0000 mg | Freq: Once | INTRAVENOUS | Status: AC
  Administered 2015-06-01: 20 mg via INTRAVENOUS
  Filled 2015-06-01: qty 50

## 2015-06-01 MED ORDER — PREDNISONE 50 MG PO TABS: 50.0000 mg | ORAL_TABLET | Freq: Every day | ORAL | Status: DC

## 2015-06-01 MED ORDER — METHYLPREDNISOLONE SODIUM SUCC 125 MG IJ SOLR
125.0000 mg | Freq: Once | INTRAMUSCULAR | Status: AC
  Administered 2015-06-01: 125 mg via INTRAVENOUS
  Filled 2015-06-01: qty 2

## 2015-06-01 MED ORDER — DIPHENHYDRAMINE HCL 25 MG PO TABS: 25.0000 mg | ORAL_TABLET | Freq: Four times a day (QID) | ORAL | Status: DC

## 2015-06-01 MED ORDER — DIPHENHYDRAMINE HCL 50 MG/ML IJ SOLN
25.0000 mg | Freq: Once | INTRAMUSCULAR | Status: AC
  Administered 2015-06-01: 25 mg via INTRAVENOUS
  Filled 2015-06-01: qty 1

## 2015-06-01 NOTE — ED Notes (Signed)
Pt. Noticed rash to face starting 1 week ago. Pt. sts it has been getting worse since then. Rash/hives noted to face, neck, and abdomen. Pt. Denies any trouble breathing, but does endorse itching. Pt. Speaks nepali with son and wife speaking english.

## 2015-06-01 NOTE — ED Provider Notes (Signed)
CSN: [id number]     Arrival date & time 06/01/15  1045 History   First MD Initiated Contact with Patient 06/01/15 1059     Chief Complaint  Patient presents with  . Rash     (Consider location/radiation/quality/duration/timing/severity/associated sxs/prior Treatment) The history is provided by the patient. A language interpreter was used.  Cody Ross is a 50 y.o. male history of urticaria here presenting with rash on face, neck. Patient starts having a rash that started on his face that progressed to his neck as well as torso. He states that it is itchy. Denies any trouble breathing when any new food or shampoo or medicines. He had urticaria several years ago and was thought to have possible eczema and was improving on steroid cream. Did not take any medicines for it today.     Son translating   No past medical history on file. No past surgical history on file. No family history on file. Social History  Substance Use Topics  . Smoking status: Never Smoker   . Smokeless tobacco: Not on file  . Alcohol Use: No    Review of Systems  Skin: Positive for rash.  All other systems reviewed and are negative.     Allergies  Review of patient's allergies indicates no known allergies.  Home Medications   Prior to Admission medications   Medication Sig Start Date End Date Taking? Authorizing Provider  diphenhydrAMINE (BENADRYL) 25 MG tablet Take 1 tablet (25 mg total) by mouth every 6 (six) hours. 02/11/14  Yes Benjamin Cartner, PA-C   BP 129/82 mmHg  Pulse 62  Temp(Src) 98.3 F (36.8 C) (Oral)  Resp 15  Ht 5\' 3"  (1.6 m)  Wt 137 lb (62.143 kg)  BMI 24.27 kg/m2  SpO2 100% Physical Exam  Constitutional: He is oriented to person, place, and time. He appears well-developed and well-nourished.  HENT:  Head: Normocephalic.  Right Ear: External ear normal.  Left Ear: External ear normal.  Mouth/Throat: Oropharynx is clear and moist.  Rash involving R eyelid, diffusely on the  face. Not involving mucous membranes. Also involving most of the neck. No intra oral lesions   Eyes: Conjunctivae are normal. Pupils are equal, round, and reactive to light.  Neck: Normal range of motion. Neck supple.  Cardiovascular: Normal rate, regular rhythm and normal heart sounds.   Pulmonary/Chest: Effort normal and breath sounds normal. No respiratory distress. He has no wheezes. He has no rales.  Abdominal: Soft. Bowel sounds are normal. He exhibits no distension. There is no tenderness. There is no rebound.  Musculoskeletal: Normal range of motion.  Neurological: He is alert and oriented to person, place, and time.  Skin:  Rash on face and neck as described. Small urticaria on torso and back. No petechiae or vesicular lesions or cellulitis   Psychiatric: He has a normal mood and affect. His behavior is normal. Judgment and thought content normal.  Nursing note and vitals reviewed.   ED Course  Procedures (including critical care time) Labs Review Labs Reviewed  BASIC METABOLIC PANEL - Abnormal; Notable for the following:    CO2 20 (*)    Glucose, Bld 102 (*)    Calcium 8.8 (*)    All other components within normal limits  CBC WITH DIFFERENTIAL/PLATELET    Imaging Review No results found. I have personally reviewed and evaluated these images and lab results as part of my medical decision-making.   EKG Interpretation None      MDM   Final  diagnoses:  None   Cody Ross is a 50 y.o. male here with urticaria. Afebrile, no new meds. No sinus congestion to suggest seasonal allergies. No intra oral lesions. No signs of SJS. I think likely viral. Given steroids, benadryl, pepcid and felt better. Will dc home with steroids, benadryl.     Richardean Canal, MD 06/01/15 1322

## 2015-06-01 NOTE — Discharge Instructions (Signed)
Take prednisone daily for 5 days.   Take benadryl 25 mg every 6 hrs for itchiness.   See your doctor.  Return to ER if you have worse itchiness or rash, trouble breathing, trouble swallowing.

## 2016-08-14 ENCOUNTER — Ambulatory Visit
Admission: EM | Admit: 2016-08-14 | Discharge: 2016-08-14 | Disposition: A | Discharge status: Home / Self Care | Payer: Worker's Compensation | Attending: Family Medicine | Admitting: Family Medicine

## 2016-08-14 ENCOUNTER — Ambulatory Visit (INDEPENDENT_AMBULATORY_CARE_PROVIDER_SITE_OTHER): Payer: Worker's Compensation

## 2016-08-14 DIAGNOSIS — M79672 Pain in left foot: Secondary | ICD-10-CM

## 2016-08-14 DIAGNOSIS — M10072 Idiopathic gout, left ankle and foot: Secondary | ICD-10-CM

## 2016-08-14 DIAGNOSIS — M109 Gout, unspecified: Secondary | ICD-10-CM | POA: Diagnosis not present

## 2016-08-14 LAB — CBC WITH DIFFERENTIAL/PLATELET
Basophils Absolute: 0.1 10*3/uL (ref 0–0.1)
Basophils Relative: 1 %
EOS PCT: 1 %
Eosinophils Absolute: 0.1 10*3/uL (ref 0–0.7)
HEMATOCRIT: 41.5 % (ref 40.0–52.0)
Hemoglobin: 13.7 g/dL (ref 13.0–18.0)
LYMPHS ABS: 1.7 10*3/uL (ref 1.0–3.6)
Lymphocytes Relative: 12 %
MCH: 28.2 pg (ref 26.0–34.0)
MCHC: 32.9 g/dL (ref 32.0–36.0)
MCV: 85.9 fL (ref 80.0–100.0)
MONO ABS: 1.3 10*3/uL — AB (ref 0.2–1.0)
Monocytes Relative: 10 %
Neutro Abs: 10.8 10*3/uL — ABNORMAL HIGH (ref 1.4–6.5)
Neutrophils Relative %: 76 %
PLATELETS: 124 10*3/uL — AB (ref 150–440)
RBC: 4.83 MIL/uL (ref 4.40–5.90)
RDW: 13.6 % (ref 11.5–14.5)
WBC: 14 10*3/uL — ABNORMAL HIGH (ref 3.8–10.6)

## 2016-08-14 LAB — BASIC METABOLIC PANEL
Anion gap: 9 (ref 5–15)
BUN: 20 mg/dL (ref 6–20)
CO2: 25 mmol/L (ref 22–32)
Calcium: 8.8 mg/dL — ABNORMAL LOW (ref 8.9–10.3)
Chloride: 104 mmol/L (ref 101–111)
Creatinine, Ser: 0.98 mg/dL (ref 0.61–1.24)
GFR calc Af Amer: >60 mL/min (ref >60)
GLUCOSE: 112 mg/dL — AB (ref 65–99)
POTASSIUM: 3.6 mmol/L (ref 3.5–5.1)
Sodium: 138 mmol/L (ref 135–145)

## 2016-08-14 LAB — URIC ACID: Uric Acid, Serum: 10 mg/dL — ABNORMAL HIGH (ref 4.4–7.6)

## 2016-08-14 MED ORDER — OXYCODONE-ACETAMINOPHEN 5-325 MG PO TABS: 1.0000 | ORAL_TABLET | Freq: Three times a day (TID) | ORAL | 0 refills | Status: DC | PRN

## 2016-08-14 MED ORDER — INDOMETHACIN 50 MG PO CAPS: 50.0000 mg | ORAL_CAPSULE | Freq: Three times a day (TID) | ORAL | 0 refills | Status: DC | PRN

## 2016-08-14 NOTE — ED Provider Notes (Signed)
[facility] ____________________________________________  Time seen: Approximately 5:56 PM  I have reviewed the triage vital signs and the nursing notes.   HISTORY  Chief Complaint Work Related Injury and Foot Pain   Interpretor used: Nepali  HPI Cody Ross is a 51 y.o. male presenting for evaluation of left foot pain is in present since last night. Interpreter electronic device used at bedside, and it was difficult to obtained full detailed history. Patient reports last night he was at work and a type of the cart hit his foot causing pain. Patient states he does not remember any pain prior to the injury. States pain is from the injury. Patient does report similar pain to the same foot before after an injury. Denies any fall to the ground, head injury or loss of consciousness.   Reports pain is a throbbing moderate pain that is worse with direct palpation and ambulation. Reports has continued to remain active. Denies any other pain or injury. Denies paresthesias. Reports is hard to bend his great toe due to the tightness. Denies insect bites or break in skin. Denies fevers. Reports otherwise feels well. Has not taken any over-the-counter medications for the same complaint. Patient states in the past the pain went away with anti-inflammatory and pain medications as well as wearing sandals. Denies known history of gout.  Denies chest pain, shortness of breath, abdominal pain, or rash. Denies recent sickness. Denies recent antibiotic use. Denies any renal insufficiency, renal problems or heart problems.  PCP: unsure of their name   History reviewed. No pertinent past medical history. denies There are no active problems to display for this patient.   History reviewed. No pertinent surgical history.   No current facility-administered medications for this encounter.   Current Outpatient Prescriptions:  .  diphenhydrAMINE (BENADRYL) 25 MG tablet, Take 1 tablet (25 mg  total) by mouth every 6 (six) hours., Disp: 20 tablet, Rfl: 0 .  indomethacin (INDOCIN) 50 MG capsule, Take 1 capsule (50 mg total) by mouth 3 (three) times daily as needed for mild pain or moderate pain (take with food)., Disp: 15 capsule, Rfl: 0 .  oxyCODONE-acetaminophen (ROXICET) 5-325 MG tablet, Take 1 tablet by mouth every 8 (eight) hours as needed for moderate pain or severe pain (Do not drive or operate heavy machinery while taking as can cause drowsiness.)., Disp: 9 tablet, Rfl: 0 .  predniSONE (DELTASONE) 50 MG tablet, Take 1 tablet (50 mg total) by mouth daily with breakfast., Disp: 5 tablet, Rfl: 0  Allergies Patient has no known allergies.  History reviewed. No pertinent family history.  Social History Social History  Substance Use Topics  . Smoking status: Never Smoker  . Smokeless tobacco: Never Used  . Alcohol use Yes     Comment: occasionally    Review of Systems Constitutional: No fever/chills Cardiovascular: Denies chest pain. Respiratory: Denies shortness of breath. Gastrointestinal: No abdominal pain.   Musculoskeletal: Negative for back pain. Skin: Negative for rash.   ____________________________________________   PHYSICAL EXAM:  VITAL SIGNS: ED Triage Vitals [08/14/16 1755]  Enc Vitals Group     BP (!) 142/90     Pulse Rate 73     Resp 18     Temp 99.3 F (37.4 C)     Temp Source Oral     SpO2 99 %     Weight      Height      Head Circumference      Peak Flow  Pain Score      Pain Loc      Pain Edu?      Excl. in GC?    Vitals:   08/14/16 1755 08/14/16 1832  BP: (!) 142/90 (!) 150/89  Pulse: 73 65  Resp: 18   Temp: 99.3 F (37.4 C) 99.2 F (37.3 C)  TempSrc: Oral Oral  SpO2: 99% 100%     Constitutional: Alert and oriented. Well appearing and in no acute distress. Cardiovascular: Normal rate, regular rhythm. Grossly normal heart sounds.  Good peripheral circulation. Respiratory: Normal respiratory effort without tachypnea  nor retractions. Breath sounds are clear and equal bilaterally. No wheezes, rales, rhonchi. Musculoskeletal:  No midline cervical, thoracic or lumbar tenderness to palpation.       Right lower leg:  No tenderness or edema.      Left lower leg:  No tenderness or edema.  Except: Left foot at the MTP joint mild edema, moderate tenderness to direct palpation, minimal erythema and warmth to the MTP joint, no break in skin, no other erythema, full range of motion to left great toe and left foot, distal sensation intact, left foot otherwise nontender. Bilateral pedal pulses equal and easily palpated. Left lower extremity otherwise nontender. Ambulatory with mild antalgic gait. Neurologic:  Normal speech and language. Speech is normal. No gait instability.  Skin:  Skin is warm, dry. Psychiatric: Mood and affect are normal. Speech and behavior are normal. Patient exhibits appropriate insight and judgment   ___________________________________________   LABS (all labs ordered are listed, but only abnormal results are displayed)  Labs Reviewed  CBC WITH DIFFERENTIAL/PLATELET - Abnormal; Notable for the following:       Result Value   WBC 14.0 (*)    Platelets 124 (*)    Neutro Abs 10.8 (*)    Monocytes Absolute 1.3 (*)    All other components within normal limits  BASIC METABOLIC PANEL - Abnormal; Notable for the following:    Glucose, Bld 112 (*)    Calcium 8.8 (*)    All other components within normal limits  URIC ACID - Abnormal; Notable for the following:    Uric Acid, Serum 10.0 (*)    All other components within normal limits    RADIOLOGY  Dg Foot Complete Left  Result Date: 08/14/2016 CLINICAL DATA:  Left anterior foot pain after an object fell on his foot. EXAM: LEFT FOOT - COMPLETE 3+ VIEW COMPARISON:  Left foot radiograph 11/09/2013 FINDINGS: There is no evidence of fracture or dislocation. There is no evidence of arthropathy or other focal bone abnormality. Soft tissues are  unremarkable. IMPRESSION: No fracture or dislocation of the left foot. Electronically Signed   By: Deatra Robinson M.D.   On: 08/14/2016 18:21   ____________________________________________   PROCEDURES Procedures    INITIAL IMPRESSION / ASSESSMENT AND PLAN / ED COURSE  Pertinent labs & imaging results that were available during my care of the patient were reviewed by me and considered in my medical decision making (see chart for details).  Very well-appearing patient. No acute distress. Presenting for evaluation of left foot pain at the base of the great toe. Patient reporting from injury from work, but reports past similar pain. Patient reports Worker's Compensation injury. However per [facility] CMA, employer stating that this was not from a work event. Clinical presentation consistent with acute gout, but as patient reports injury will evaluate x-ray. Discussed with patient evaluation of laboratory studies including BMP, CBC and uric acid and patient  requests for this to be completed. Melissa CMA discussed with patient that if Worker's Compensation case they may not cover for laboratory studies, patient verbalizes this as understanding and requests to proceed with labs.  Again with interpreter utilized. Lab results discussed in detail with patient including need for follow-up and repeat and recheck of his labs. Patient uric acid was 10. WBC 14 and platelets 124. Encouraged patient to have close primary care follow-up regarding these labs as well as elevated blood pressure. Suspect current pain is acute gout to left MTP joint. Counseled encouraged patient to monitor for any redness or fevers or concern for infection. No clear source of infection at this time and no clear indication for antibiotics currently. X-ray left foot negative for radiologist. Discussed in detail with patient unclear if gout pain was flared up from the local injury or if spontaneous, but suspect gout pain as current pain.  Discussed will treat patient with oral indomethacin and when necessary Percocet as needed for breakthrough pain as well as use of postoperative shoe.  Again encouraged patient to follow-up closely with primary care physician. Interpreter reported to myself that he is unsure if patient was fully understanding directions of plan of care; and encouraged patient to discuss directions and follow-up with family at home. Again unclear if this is a definitive Worker's Compensation case. Encouraged patient to follow-up with [facility] for continued pain if Worker's Compensation case, but also encouraged to follow-up closely with primary care.Discussed indication, risks and benefits of medications with patient. Note was given that patient may return to work but wearing postoperative shoe for 5 days. Encouraged rest and elevation otherwise.  Discussed follow up with Primary care physician this week. Discussed follow up and return parameters including no resolution or any worsening concerns. Patient verbalized understanding and agreed to plan.     [place] controlled substance database reviewe, and no recent controlled medications documented. ____________________________________________   FINAL CLINICAL IMPRESSION(S) / ED DIAGNOSES  Final diagnoses:  Left foot pain  Acute gout involving toe of left foot, unspecified cause     Discharge Medication List as of 08/14/2016  6:59 PM    START taking these medications   Details  indomethacin (INDOCIN) 50 MG capsule Take 1 capsule (50 mg total) by mouth 3 (three) times daily as needed for mild pain or moderate pain (take with food)., Starting Fri 08/14/2016, Print    oxyCODONE-acetaminophen (ROXICET) 5-325 MG tablet Take 1 tablet by mouth every 8 (eight) hours as needed for moderate pain or severe pain (Do not drive or operate heavy machinery while taking as can cause drowsiness.)., Starting Fri 08/14/2016, Print        Note: This dictation was  prepared with Dragon dictation along with smaller phrase technology. Any transcriptional errors that result from this process are unintentional.         Renford DillsMiller, Asyia Hornung, NP 08/14/16 1930    Renford DillsMiller, Bain Whichard, NP 08/14/16 1930    Renford DillsMiller, Kensi Karr, NP 08/14/16 1931    Renford DillsMiller, Dovey Fatzinger, NP 08/14/16 1935

## 2016-08-14 NOTE — ED Triage Notes (Signed)
Patient complains of left foot pain that occurred after an object hit his foot at work today. Patient has redness and swelling in foot. Interpreter [place] was used for this triage.

## 2016-08-14 NOTE — Discharge Instructions (Signed)
Take medication as prescribed. Rest. Drink plenty of fluids.   If workers compensation injury, follow up with the above.   Follow up with your primary care physician this week as discussed. You need to follow up regarding your lab results, including WBC, platelet, uric acid and others.  Return to Urgent care for new or worsening concerns.

## 2016-10-04 ENCOUNTER — Ambulatory Visit (HOSPITAL_COMMUNITY)
Admission: EM | Admit: 2016-10-04 | Discharge: 2016-10-04 | Disposition: A | Discharge status: Home / Self Care | Payer: 59 | Attending: Family Medicine | Admitting: Family Medicine

## 2016-10-04 ENCOUNTER — Encounter (HOSPITAL_COMMUNITY): Payer: Self-pay

## 2016-10-04 DIAGNOSIS — M109 Gout, unspecified: Secondary | ICD-10-CM

## 2016-10-04 DIAGNOSIS — M79675 Pain in left toe(s): Secondary | ICD-10-CM

## 2016-10-04 MED ORDER — INDOMETHACIN 50 MG PO CAPS: 50.0000 mg | ORAL_CAPSULE | Freq: Three times a day (TID) | ORAL | 0 refills | Status: AC

## 2016-10-04 NOTE — ED Triage Notes (Signed)
Pt presents today with left foot pain that is mainly around his big toe that started on Friday. The foot appears to be red and swollen around the big toe area. Has had this problem before. Does not have hx of gout per family.

## 2016-10-04 NOTE — Discharge Instructions (Signed)
Recommend start Indocin 1 capsule three times a day as directed for pain and swelling. Need to see PCP this week for further evaluation with lab work and to get started on medication to decrease recurrence of gout. If pain gets worse, go to ER. Otherwise, follow-up with your PCP as planned.

## 2016-10-05 NOTE — ED Provider Notes (Signed)
[facility]    CSN: [id number] Arrival date & time: 10/04/16  1322     History   Chief Complaint Chief Complaint  Patient presents with  . Foot Pain    HPI Cody Ross is a 51 y.o. male.   51 year old male accompanied by his wife and caregiver. The patient and his wife do not speak [place] and offered our video interpreter but patient declined- wants to use caregiver as interpreter.  He presents today with recurrence of left great toe/foot pain that started about 3 days ago. It is red, swollen and very tender and getting worse. He was seen at [facility] 6 weeks ago (08/14/16) and dx with gout. He had thought he had injured his foot at work. He had a negative x-ray done and uric acid levels that were elevated (10.0). His glucose and WBC's were also slightly elevated at that time. He was placed on Indocin and Percocet as needed and told he needed to see a PCP within a week for additional labwork and medication for gout. He never saw a PCP and returns today with pain and swelling in the same joint. No other chronic health issues except blood pressure continues to remain slightly elevated today.     The history is provided by a caregiver, the patient and a relative. The history is limited by a language barrier.    History reviewed. No pertinent past medical history.  There are no active problems to display for this patient.   History reviewed. No pertinent surgical history.     Home Medications    Prior to Admission medications   Medication Sig Start Date End Date Taking? Authorizing Provider  indomethacin (INDOCIN) 50 MG capsule Take 1 capsule (50 mg total) by mouth 3 (three) times daily with meals. 10/04/16 10/09/16  Sudie Grumbling, NP    Family History History reviewed. No pertinent family history.  Social History Social History  Substance Use Topics  . Smoking status: Never Smoker  . Smokeless tobacco: Never Used  . Alcohol use Yes     Comment:  occasionally     Allergies   Patient has no known allergies.   Review of Systems Review of Systems  Constitutional: Negative for appetite change, chills, fatigue and fever.  Respiratory: Negative for cough, chest tightness and shortness of breath.   Cardiovascular: Negative for chest pain and leg swelling.  Gastrointestinal: Negative for abdominal pain, nausea and vomiting.  Musculoskeletal: Positive for arthralgias, gait problem and joint swelling. Negative for back pain.  Skin: Positive for color change. Negative for rash and wound.  Allergic/Immunologic: Negative for immunocompromised state.  Neurological: Negative for dizziness, tremors, syncope, weakness, light-headedness, numbness and headaches.  Hematological: Negative for adenopathy. Does not bruise/bleed easily.     Physical Exam Triage Vital Signs ED Triage Vitals  Enc Vitals Group     BP 10/04/16 1444 139/88     Pulse Rate 10/04/16 1444 70     Resp 10/04/16 1444 16     Temp 10/04/16 1444 99.4 F (37.4 C)     Temp Source 10/04/16 1444 Oral     SpO2 10/04/16 1444 99 %     Weight --      Height --      Head Circumference --      Peak Flow --      Pain Score 10/04/16 1447 9     Pain Loc --      Pain Edu? --  Excl. in GC? --    No data found.   Updated Vital Signs BP 139/88 (BP Location: Right Arm)   Pulse 70   Temp 99.4 F (37.4 C) (Oral)   Resp 16   SpO2 99%   Visual Acuity Right Eye Distance:   Left Eye Distance:   Bilateral Distance:    Right Eye Near:   Left Eye Near:    Bilateral Near:     Physical Exam  Constitutional: He is oriented to person, place, and time. He appears well-developed and well-nourished. No distress.  Sitting comfortably in wheel chair  HENT:  Head: Normocephalic and atraumatic.  Eyes: Conjunctivae and EOM are normal.  Neck: Normal range of motion.  Cardiovascular: Normal rate and regular rhythm.   Pulmonary/Chest: Effort normal.  Musculoskeletal: Normal range  of motion. He exhibits edema and tenderness.       Left foot: There is tenderness and swelling. There is normal range of motion, normal capillary refill and no crepitus.       Feet:  1st MTP joint of left foot red, swollen and very tender. Has full range of motion but with pain. Good pulses and capillary refill. No neuro deficits noted. No skin breakdown or wound.   Neurological: He is alert and oriented to person, place, and time. He has normal strength. No sensory deficit.  Skin: Skin is warm, dry and intact. Capillary refill takes less than 2 seconds. No rash noted. There is erythema.  Psychiatric: He has a normal mood and affect.     UC Treatments / Results  Labs (all labs ordered are listed, but only abnormal results are displayed) Labs Reviewed - No data to display  EKG  EKG Interpretation None       Radiology No results found.  Procedures Procedures (including critical care time)  Medications Ordered in UC Medications - No data to display   Initial Impression / Assessment and Plan / UC Course  I have reviewed the triage vital signs and the nursing notes.  Pertinent labs & imaging results that were available during my care of the patient were reviewed by me and considered in my medical decision making (see chart for details).    Discussed with patient and family that he continues to have gout. He needs to see his PCP for continued monitoring of labs and to start on daily medication (such as Allopurinol) to prevent additional flare-ups of gout. Since he did well on Indomethacin 6 weeks ago- will start again- Indocin 50mg  3 times a day as directed. Do not feel he needs antibiotics at this time. Need to continue to monitor BP as well since was elevated at previous visit and in the upper range of normal today- may be due to pain. Emphasized again with family that he needs to see his PCP this week if possible. If pain or swelling worsens, go to ER. Otherwise follow-up with his  PCP as planned.   Final Clinical Impressions(s) / UC Diagnoses   Final diagnoses:  Great toe pain, left  Acute gout of left foot, unspecified cause    New Prescriptions Discharge Medication List as of 10/04/2016  4:05 PM       Controlled Substance Prescriptions [place] Controlled Substance Registry consulted? No   Sudie GrumblingAmyot, Ann Berry, NP 10/05/16 1145

## 2017-08-17 ENCOUNTER — Emergency Department (HOSPITAL_COMMUNITY)
Admission: EM | Admit: 2017-08-17 | Discharge: 2017-08-18 | Disposition: A | Discharge status: Home / Self Care | Payer: 59 | Attending: Emergency Medicine | Admitting: Emergency Medicine

## 2017-08-17 ENCOUNTER — Encounter (HOSPITAL_COMMUNITY): Payer: Self-pay | Admitting: Emergency Medicine

## 2017-08-17 ENCOUNTER — Emergency Department (HOSPITAL_COMMUNITY): Payer: 59

## 2017-08-17 ENCOUNTER — Other Ambulatory Visit: Payer: Self-pay

## 2017-08-17 DIAGNOSIS — M25561 Pain in right knee: Secondary | ICD-10-CM | POA: Diagnosis present

## 2017-08-17 MED ORDER — LIDOCAINE-EPINEPHRINE (PF) 2 %-1:200000 IJ SOLN
20.0000 mL | Freq: Once | INTRAMUSCULAR | Status: AC
  Administered 2017-08-18: 20 mL via INTRADERMAL
  Filled 2017-08-17: qty 20

## 2017-08-17 NOTE — ED Triage Notes (Signed)
Pt's states Friday after work began having right knee pain. Worse all weekend and today began having a fever. Swelling noted. Hurts to walk.

## 2017-08-17 NOTE — ED Notes (Signed)
Suture cart at bedside 

## 2017-08-17 NOTE — ED Provider Notes (Signed)
[facility] EMERGENCY DEPARTMENT Provider Note   CSN: [id number] Arrival date & time: 08/17/17  2017     History   Chief Complaint Chief Complaint  Patient presents with  . Knee Pain    HPI Cody Ross is a 52 y.o. male presents today for evaluation of acute onset, progressively worsening right knee pain for 4 days.  Patient primarily speaks [place]  and his son served as Nurse, learning disability.  They refused formal language interpreter services multiple times.  Patient denies any recent trauma or falls.  Pain is constant, sharp in nature, radiates up to the right thigh.  Intermittent tingling sensation.  Pain worsens with extension of the knee and he has difficulty walking on it.  He notes swelling to the knee.  States he has felt feverish but did not take his temperature.  No nausea or vomiting.  Has tried ice without relief of his symptoms.Was seen in urgent care on August 2018 for evaluation of left great toe pain and was diagnosed with gout but states this feels different.  He denies recent ingestion of shellfish, beer, or red meats.  It is difficult to obtain a full detailed history.  No known medical history otherwise.   The history is provided by the patient and a relative. The history is limited by a language barrier.  No language interpreter was used.    History reviewed. No pertinent past medical history.  There are no active problems to display for this patient.   History reviewed. No pertinent surgical history.      Home Medications    Prior to Admission medications   Not on File    Family History No family history on file.  Social History Social History   Tobacco Use  . Smoking status: Never Smoker  . Smokeless tobacco: Never Used  Substance Use Topics  . Alcohol use: Yes    Comment: occasionally  . Drug use: No     Allergies   Patient has no known allergies.   Review of Systems Review of Systems  Constitutional: Positive for fever.    Gastrointestinal: Negative for nausea and vomiting.  Musculoskeletal: Positive for arthralgias.  Neurological: Negative for syncope and weakness.     Physical Exam Updated Vital Signs BP (!) 145/86   Pulse 64   Temp 99.7 F (37.6 C) (Oral)   Resp 18   SpO2 100%   Physical Exam  Constitutional: He appears well-developed and well-nourished. No distress.  HENT:  Head: Normocephalic and atraumatic.  Eyes: Conjunctivae are normal. Right eye exhibits no discharge. Left eye exhibits no discharge.  Neck: No JVD present. No tracheal deviation present.  Cardiovascular: Normal rate and intact distal pulses.  2+ radial pulses bilaterally, no lower extremity edema  Pulmonary/Chest: Effort normal.  Abdominal: He exhibits no distension.  Musculoskeletal: He exhibits tenderness. He exhibits no edema.  Right knee with effusion, warmth compared to the left.  Severely limited active and passive range of motion secondary to pain, examination limited secondary to pain.  5/5 strength of BLE major muscle groups.  Patient ambulates with an antalgic gait, barely able to bear weight on the right lower extremity  Neurological: He is alert.  Fluent speech, no facial droop, sensation intact to soft touch of bilateral lower extremities.  Skin: Skin is warm and dry. No erythema.  Psychiatric: He has a normal mood and affect. His behavior is normal.  Nursing note and vitals reviewed.    ED Treatments / Results  Labs (  all labs ordered are listed, but only abnormal results are displayed) Labs Reviewed  CBC WITH DIFFERENTIAL/PLATELET - Abnormal; Notable for the following components:      Result Value   WBC 15.0 (*)    Platelets 145 (*)    Neutro Abs 12.5 (*)    Monocytes Absolute 1.1 (*)    All other components within normal limits  BODY FLUID CULTURE  GRAM STAIN  C-REACTIVE PROTEIN  GLUCOSE, BODY FLUID OTHER  PROTEIN, BODY FLUID (OTHER)  SYNOVIAL CELL COUNT + DIFF, W/ CRYSTALS  BASIC  METABOLIC PANEL  GC/CHLAMYDIA PROBE AMP ([place]) NOT AT [facility]    EKG None  Radiology Dg Knee Complete 4 Views Right  Result Date: 08/17/2017 CLINICAL DATA:  Right knee pain.  Injury EXAM: RIGHT KNEE - COMPLETE 4+ VIEW COMPARISON:  None. FINDINGS: Negative for fracture. Normal joint spaces. Probable joint effusion. IMPRESSION: Probable joint effusion.  No fracture or arthropathy. Electronically Signed   By: Marlan Palauharles  Clark M.D.   On: 08/17/2017 21:21    Procedures Procedures (including critical care time)  Medications Ordered in ED Medications  lidocaine-EPINEPHrine (XYLOCAINE W/EPI) 2 %-1:200000 (PF) injection 20 mL (has no administration in time range)  doxycycline (VIBRA-TABS) tablet 100 mg (has no administration in time range)  HYDROcodone-acetaminophen (NORCO/VICODIN) 5-325 MG per tablet 1 tablet (has no administration in time range)     Initial Impression / Assessment and Plan / ED Course  I have reviewed the triage vital signs and the nursing notes.  Pertinent labs & imaging results that were available during my care of the patient were reviewed by me and considered in my medical decision making (see chart for details).     Patient with 4-day history of atraumatic right knee pain.  History of subjective fevers at home.  States this does not feel like the gout flare that he experienced in August.  This was diagnosed at an urgent care, he has not followed up with an orthopedist or primary care physician for reevaluation of his possible gout flare. Xrays show effusion, no osseous abnormalities.  With concern for septic joint in the presence of significantly reduced range of motion, overlying warmth, and constitutional symptoms he will be taken to the acute side for arthrocentesis and further evaluation. Final Clinical Impressions(s) / ED Diagnoses   Final diagnoses:  Acute pain of right knee    ED Discharge Orders    None      Jeanie SewerFawze, Wyett Narine A, PA-C 08/18/17  0111  Linwood DibblesKnapp, Jon, MD 08/19/17 1426

## 2017-08-18 LAB — GRAM STAIN

## 2017-08-18 LAB — CBC WITH DIFFERENTIAL/PLATELET
Abs Immature Granulocytes: 0 10*3/uL (ref 0.0–0.1)
BASOS ABS: 0.1 10*3/uL (ref 0.0–0.1)
Basophils Relative: 1 %
EOS ABS: 0.1 10*3/uL (ref 0.0–0.7)
EOS PCT: 0 %
HCT: 41.6 % (ref 39.0–52.0)
HEMOGLOBIN: 13.4 g/dL (ref 13.0–17.0)
Immature Granulocytes: 0 %
LYMPHS PCT: 8 %
Lymphs Abs: 1.2 10*3/uL (ref 0.7–4.0)
MCH: 28.4 pg (ref 26.0–34.0)
MCHC: 32.2 g/dL (ref 30.0–36.0)
MCV: 88.1 fL (ref 78.0–100.0)
MONO ABS: 1.1 10*3/uL — AB (ref 0.1–1.0)
Monocytes Relative: 7 %
Neutro Abs: 12.5 10*3/uL — ABNORMAL HIGH (ref 1.7–7.7)
Neutrophils Relative %: 84 %
Platelets: 145 10*3/uL — ABNORMAL LOW (ref 150–400)
RBC: 4.72 MIL/uL (ref 4.22–5.81)
RDW: 13.2 % (ref 11.5–15.5)
WBC: 15 10*3/uL — ABNORMAL HIGH (ref 4.0–10.5)

## 2017-08-18 LAB — BASIC METABOLIC PANEL
Anion gap: 13 (ref 5–15)
BUN: 14 mg/dL (ref 6–20)
CALCIUM: 9.3 mg/dL (ref 8.9–10.3)
CHLORIDE: 107 mmol/L (ref 98–111)
CO2: 19 mmol/L — AB (ref 22–32)
CREATININE: 0.92 mg/dL (ref 0.61–1.24)
GFR calc Af Amer: >60 mL/min (ref >60)
GFR calc non Af Amer: >60 mL/min (ref >60)
Glucose, Bld: 103 mg/dL — ABNORMAL HIGH (ref 70–99)
Potassium: 4 mmol/L (ref 3.5–5.1)
SODIUM: 139 mmol/L (ref 135–145)

## 2017-08-18 LAB — C-REACTIVE PROTEIN

## 2017-08-18 LAB — SYNOVIAL CELL COUNT + DIFF, W/ CRYSTALS
Crystals, Fluid: NONE SEEN
Eosinophils-Synovial: 0 % (ref 0–1)
Lymphocytes-Synovial Fld: 3 % (ref 0–20)
Monocyte-Macrophage-Synovial Fluid: 60 % (ref 50–90)
Neutrophil, Synovial: 37 % — ABNORMAL HIGH (ref 0–25)
WBC, Synovial: 149 /mm3 (ref 0–200)

## 2017-08-18 MED ORDER — ACETAMINOPHEN 325 MG PO TABS: 650.0000 mg | ORAL_TABLET | Freq: Four times a day (QID) | ORAL | 0 refills | Status: AC | PRN

## 2017-08-18 MED ORDER — HYDROCODONE-ACETAMINOPHEN 5-325 MG PO TABS
1.0000 | ORAL_TABLET | Freq: Once | ORAL | Status: AC
  Administered 2017-08-18: 1 via ORAL
  Filled 2017-08-18: qty 1

## 2017-08-18 MED ORDER — DOXYCYCLINE HYCLATE 100 MG PO TABS
100.0000 mg | ORAL_TABLET | Freq: Once | ORAL | Status: AC
  Administered 2017-08-18: 100 mg via ORAL
  Filled 2017-08-18: qty 1

## 2017-08-18 MED ORDER — IBUPROFEN 600 MG PO TABS: 600.0000 mg | ORAL_TABLET | Freq: Four times a day (QID) | ORAL | 0 refills | Status: DC | PRN

## 2017-08-18 MED ORDER — CEPHALEXIN 500 MG PO CAPS: 500.0000 mg | ORAL_CAPSULE | Freq: Four times a day (QID) | ORAL | 0 refills | Status: DC

## 2017-08-18 MED ORDER — TRIAMCINOLONE ACETONIDE 10 MG/ML IJ SUSP
25.0000 mg | Freq: Once | INTRAMUSCULAR | Status: DC
  Filled 2017-08-18: qty 2.5

## 2017-08-18 NOTE — Consult Note (Signed)
Reason for Consult:right knee pain Referring Physician: EDP  Cody Ross is an 52 y.o. male.  HPI: 52 yo male with history of right knee pain without trauma for 5 days.  Increased pain over last 24 hours and now difficulty with ambulation.  Denies recent illness.  History reviewed. No pertinent past medical history.  History reviewed. No pertinent surgical history.  No family history on file.  Social History:  reports that he has never smoked. He has never used smokeless tobacco. He reports that he drinks alcohol. He reports that he does not use drugs.  Allergies: No Known Allergies  Medications: I have reviewed the patient's current medications.  Results for orders placed or performed during the hospital encounter of 08/17/17 (from the past 48 hour(s))  CBC with Differential     Status: Abnormal   Collection Time: 08/17/17 11:52 PM  Result Value Ref Range   WBC 15.0 (H) 4.0 - 10.5 K/uL   RBC 4.72 4.22 - 5.81 MIL/uL   Hemoglobin 13.4 13.0 - 17.0 g/dL   HCT 41.6 39.0 - 52.0 %   MCV 88.1 78.0 - 100.0 fL   MCH 28.4 26.0 - 34.0 pg   MCHC 32.2 30.0 - 36.0 g/dL   RDW 13.2 11.5 - 15.5 %   Platelets 145 (L) 150 - 400 K/uL   Neutrophils Relative % 84 %   Neutro Abs 12.5 (H) 1.7 - 7.7 K/uL   Lymphocytes Relative 8 %   Lymphs Abs 1.2 0.7 - 4.0 K/uL   Monocytes Relative 7 %   Monocytes Absolute 1.1 (H) 0.1 - 1.0 K/uL   Eosinophils Relative 0 %   Eosinophils Absolute 0.1 0.0 - 0.7 K/uL   Basophils Relative 1 %   Basophils Absolute 0.1 0.0 - 0.1 K/uL   Immature Granulocytes 0 %   Abs Immature Granulocytes 0.0 0.0 - 0.1 K/uL    Comment: Performed at [facility], [address] [place], [place] [address]  Basic metabolic panel     Status: Abnormal   Collection Time: 08/17/17 11:52 PM  Result Value Ref Range   Sodium 139 135 - 145 mmol/L   Potassium 4.0 3.5 - 5.1 mmol/L   Chloride 107 98 - 111 mmol/L    Comment: Please note change in reference range.   CO2 19 (L) 22 - 32  mmol/L   Glucose, Bld 103 (H) 70 - 99 mg/dL    Comment: Please note change in reference range.   BUN 14 6 - 20 mg/dL    Comment: Please note change in reference range.   Creatinine, Ser 0.92 0.61 - 1.24 mg/dL   Calcium 9.3 8.9 - 10.3 mg/dL   GFR calc non Af Amer >60 >60 mL/min   GFR calc Af Amer >60 >60 mL/min    Comment: (NOTE) The eGFR has been calculated using the CKD EPI equation. This calculation has not been validated in all clinical situations. eGFR's persistently <60 mL/min signify possible Chronic Kidney Disease.    Anion gap 13 5 - 15    Comment: Performed at [facility] [address] [place], [place] [address]  C-reactive protein     Status: None   Collection Time: 08/17/17 11:52 PM  Result Value Ref Range   CRP <0.8 <1.0 mg/dL    Comment: Performed at [facility], [place] [address] [place], [place] [address]    Dg Knee Complete 4 Views Right  Result Date: 08/17/2017 CLINICAL DATA:  Right knee  pain.  Injury EXAM: RIGHT KNEE - COMPLETE 4+ VIEW COMPARISON:  None. FINDINGS: Negative for fracture. Normal joint spaces. Probable joint effusion. IMPRESSION: Probable joint effusion.  No fracture or arthropathy. Electronically Signed   By: Franchot Gallo M.D.   On: 08/17/2017 21:21    ROS Blood pressure 129/81, pulse 76, temperature 98.5 F (36.9 C), temperature source Oral, resp. rate 18, SpO2 100 %. Physical Exam healthy appearing male in mild distress. Left knee with no swelling and normal AROM Right knee with trace effusion and mild warmth and tenderness over the medial joint line. Pain with AROM of the knee as some guarding to my exam. Compartments supple and no erythema. No prepatellar swelling. No pain with passive hip or ankle ROM Distally NVI  Assessment/Plan: Right knee pain and swelling with no evidence for sepsis - knee aspirated and 5 cc of normal appearing joint fluid retrieved. Recommend symptomatic care.  Ice and elevation. Rest with knee  immobilizer and crutches. Activity as tolerated Would cover with po keflex for one week. Ibuprophen for pain and inflammation control Follow up with me in one week. [place] 08/18/2017, 2:05 AM

## 2017-08-18 NOTE — ED Notes (Signed)
Ortho tech paged  

## 2017-08-18 NOTE — ED Provider Notes (Signed)
[facility] EMERGENCY DEPARTMENT Provider Note   CSN: [id number] Arrival date & time: 08/17/17  2017     History   Chief Complaint Chief Complaint  Patient presents with  . Knee Pain    HPI Cody Ross is a 52 y.o. male.  HPI  52 year old comes in with chief complaint of knee pain. Patient has no medical history and does not take any medications.  He states that he started having knee pain in his right knee 4 days ago.  Pain has gradually gotten severe and now patient is unable to walk.  Patient denies any associated nausea, vomiting -but he does indicate having subjective fevers.  Patient denies any history of heavy alcohol use or meat ingestion.  There is no history of gout or trauma.  Translator was utilized.  History reviewed. No pertinent past medical history.  There are no active problems to display for this patient.   History reviewed. No pertinent surgical history.      Home Medications    Prior to Admission medications   Not on File    Family History No family history on file.  Social History Social History   Tobacco Use  . Smoking status: Never Smoker  . Smokeless tobacco: Never Used  Substance Use Topics  . Alcohol use: Yes    Comment: occasionally  . Drug use: No     Allergies   Patient has no known allergies.   Review of Systems Review of Systems  Constitutional: Positive for activity change.  Musculoskeletal: Positive for arthralgias.  Skin: Positive for rash.  Allergic/Immunologic: Negative for immunocompromised state.  Hematological: Does not bruise/bleed easily.  All other systems reviewed and are negative.    Physical Exam Updated Vital Signs BP 129/81   Pulse 76   Temp 98.5 F (36.9 C) (Oral)   Resp 18   SpO2 100%   Physical Exam  Constitutional: He is oriented to person, place, and time. He appears well-developed.  HENT:  Head: Atraumatic.  Neck: Neck supple.  Cardiovascular: Normal rate.    Pulmonary/Chest: Effort normal.  Musculoskeletal: He exhibits edema and tenderness.  Right knee is warm to touch with erythema at the anterior patellar region.  Patient has mild effusion and range of motion is compromised.  Patient is unable to ambulate secondary to pain.  Neurological: He is alert and oriented to person, place, and time.  Skin: Skin is warm.  Nursing note and vitals reviewed.    ED Treatments / Results  Labs (all labs ordered are listed, but only abnormal results are displayed) Labs Reviewed  SYNOVIAL CELL COUNT + DIFF, W/ CRYSTALS - Abnormal; Notable for the following components:      Result Value   Color, Synovial YELLOW (*)    Appearance-Synovial HAZY (*)    All other components within normal limits  CBC WITH DIFFERENTIAL/PLATELET - Abnormal; Notable for the following components:   WBC 15.0 (*)    Platelets 145 (*)    Neutro Abs 12.5 (*)    Monocytes Absolute 1.1 (*)    All other components within normal limits  BASIC METABOLIC PANEL - Abnormal; Notable for the following components:   CO2 19 (*)    Glucose, Bld 103 (*)    All other components within normal limits  BODY FLUID CULTURE  GRAM STAIN  C-REACTIVE PROTEIN  GLUCOSE, BODY FLUID OTHER  PROTEIN, BODY FLUID (OTHER)    EKG None  Radiology Dg Knee Complete 4 Views Right  Result Date:  08/17/2017 CLINICAL DATA:  Right knee pain.  Injury EXAM: RIGHT KNEE - COMPLETE 4+ VIEW COMPARISON:  None. FINDINGS: Negative for fracture. Normal joint spaces. Probable joint effusion. IMPRESSION: Probable joint effusion.  No fracture or arthropathy. Electronically Signed   By: Marlan Palauharles  Clark M.D.   On: 08/17/2017 21:21    Procedures .Joint Aspiration/Arthrocentesis Date/Time: 08/18/2017 2:36 AM Performed by: Derwood KaplanNanavati, Marlie Kuennen, MD Authorized by: Derwood KaplanNanavati, Elvis Boot, MD   Consent:    Consent obtained:  Written   Consent given by:  Patient   Risks discussed:  Infection, nerve damage, pain, bleeding and  incomplete drainage Location:    Location:  Knee   Knee:  R knee Anesthesia (see MAR for exact dosages):    Anesthesia method:  Local infiltration   Local anesthetic:  Lidocaine 1% WITH epi Procedure details:    Preparation: Patient was prepped and draped in usual sterile fashion     Needle gauge:  18 G   Ultrasound guidance: no     Approach:  Lateral Post-procedure details:    Dressing:  Adhesive bandage   Patient tolerance of procedure:  Tolerated with difficulty Comments:     Patient unable to lay still.  We were unable to aspirate any fluid from the knee joint on the lateral approach.   (including critical care time)  Medications Ordered in ED Medications  lidocaine-EPINEPHrine (XYLOCAINE W/EPI) 2 %-1:200000 (PF) injection 20 mL (20 mLs Intradermal Given 08/18/17 0146)  doxycycline (VIBRA-TABS) tablet 100 mg (100 mg Oral Given 08/18/17 0145)  HYDROcodone-acetaminophen (NORCO/VICODIN) 5-325 MG per tablet 1 tablet (1 tablet Oral Given 08/18/17 0145)     Initial Impression / Assessment and Plan / ED Course  I have reviewed the triage vital signs and the nursing notes.  Pertinent labs & imaging results that were available during my care of the patient were reviewed by me and considered in my medical decision making (see chart for details).     52 year old male with no medical history and no history of gout comes in with chief complaint of right-sided knee pain, that has gotten worse over the past 4 days and associated subjective fevers.  Differential diagnosis from the monarticular arthritis is septic arthritis, gout or pseudogout, cellulitis, bursitis.  Patient has no risk factors to get septic joint, however he also does not have any trauma.  Arthrocentesis was attempted at the bedside.  Although we were able to enter the synovium, there was no aspirate.  Ultrasound confirms very little volume of joint fluid.  Dr. Devonne DoughtyNoris, orthopedic surgery was consulted and he was able to  aspirate about 5 cc of synovial fluid which appears clear.  Orthopedic recommendations are Rice treatment with follow-up in 5 to 7 days and Keflex.  Final Clinical Impressions(s) / ED Diagnoses   Final diagnoses:  Acute pain of right knee    ED Discharge Orders    None       Derwood KaplanNanavati, Kyna Blahnik, MD 08/18/17 [phone]

## 2017-08-18 NOTE — Progress Notes (Signed)
Orthopedic Tech Progress Note Patient Details:  Cody Ross 04/16/1965 [id number]  Ortho Devices Type of Ortho Device: Crutches, Knee Immobilizer Ortho Device/Splint Location: rle Ortho Device/Splint Interventions: Ordered, Adjustment   Post Interventions Patient Tolerated: Well Instructions Provided: Care of device, Adjustment of device   Trinna PostMartinez, Kannon Baum J 08/18/2017, 3:07 AM

## 2017-08-18 NOTE — Discharge Instructions (Addendum)
We are unsure as to what has caused her to have knee pain and swelling, but it appears that there is no infection at this time.  Please start taking the antibiotics and the pain medications prescribed. Use the crutches to walk. See the orthopedic doctor in 1 week.

## 2017-08-19 LAB — PROTEIN, BODY FLUID (OTHER): Total Protein, Body Fluid Other: 1.6 g/dL

## 2017-08-19 LAB — GLUCOSE, BODY FLUID OTHER: Glucose, Body Fluid Other: 91 mg/dL

## 2017-08-21 LAB — BODY FLUID CULTURE: Culture: NO GROWTH

## 2018-03-07 ENCOUNTER — Ambulatory Visit (HOSPITAL_COMMUNITY)
Admission: EM | Admit: 2018-03-07 | Discharge: 2018-03-07 | Disposition: A | Discharge status: Home / Self Care | Payer: 59 | Attending: Family Medicine | Admitting: Family Medicine

## 2018-03-07 ENCOUNTER — Encounter (HOSPITAL_COMMUNITY): Payer: Self-pay | Admitting: Emergency Medicine

## 2018-03-07 ENCOUNTER — Ambulatory Visit (INDEPENDENT_AMBULATORY_CARE_PROVIDER_SITE_OTHER): Payer: 59

## 2018-03-07 DIAGNOSIS — M79672 Pain in left foot: Secondary | ICD-10-CM | POA: Diagnosis not present

## 2018-03-07 MED ORDER — PREDNISONE 20 MG PO TABS: ORAL_TABLET | ORAL | 0 refills | Status: DC

## 2018-03-07 MED ORDER — METHYLPREDNISOLONE ACETATE 80 MG/ML IJ SUSP
INTRAMUSCULAR | Status: AC
  Filled 2018-03-07: qty 1

## 2018-03-07 MED ORDER — METHYLPREDNISOLONE ACETATE 80 MG/ML IJ SUSP
80.0000 mg | Freq: Once | INTRAMUSCULAR | Status: AC
  Administered 2018-03-07: 80 mg via INTRAMUSCULAR

## 2018-03-07 NOTE — ED Provider Notes (Signed)
[facility]    CSN: [id number] Arrival date & time: 03/07/18  1744     History   Chief Complaint Chief Complaint  Patient presents with  . Foot Pain    HPI Cody Ross is a 53 y.o. male.   He is presenting with left great toe pain.  He is also having redness and warmth over the first left MTP joint.  Has a history of similar symptoms.  The symptoms are resolved when he takes a medication.  Denies any fevers or chills.  Denies any inciting event or trauma.  HPI  History reviewed. No pertinent past medical history.  There are no active problems to display for this patient.   History reviewed. No pertinent surgical history.     Home Medications    Prior to Admission medications   Medication Sig Start Date End Date Taking? Authorizing Provider  acetaminophen (TYLENOL) 325 MG tablet Take 2 tablets (650 mg total) by mouth every 6 (six) hours as needed. 08/18/17   Derwood KaplanNanavati, Ankit, MD  cephALEXin (KEFLEX) 500 MG capsule Take 1 capsule (500 mg total) by mouth 4 (four) times daily. Patient not taking: Reported on 03/07/2018 08/18/17   Derwood KaplanNanavati, Ankit, MD  ibuprofen (ADVIL,MOTRIN) 600 MG tablet Take 1 tablet (600 mg total) by mouth every 6 (six) hours as needed. 08/18/17   Derwood KaplanNanavati, Ankit, MD    Family History History reviewed. No pertinent family history.  Social History Social History   Tobacco Use  . Smoking status: Never Smoker  . Smokeless tobacco: Never Used  Substance Use Topics  . Alcohol use: Yes    Comment: occasionally  . Drug use: No     Allergies   Patient has no known allergies.   Review of Systems Review of Systems  Constitutional: Negative for fever.  HENT: Negative for congestion.   Respiratory: Negative for cough.   Cardiovascular: Negative for chest pain.  Gastrointestinal: Negative for abdominal pain.  Musculoskeletal: Positive for joint swelling.  Skin: Positive for color change.  Neurological: Negative for weakness.    Hematological: Negative for adenopathy.  Psychiatric/Behavioral: Negative for agitation.     Physical Exam Triage Vital Signs ED Triage Vitals  Enc Vitals Group     BP 03/07/18 1844 (!) 138/91     Pulse Rate 03/07/18 1844 78     Resp 03/07/18 1844 18     Temp 03/07/18 1844 98.6 F (37 C)     Temp Source 03/07/18 1844 Oral     SpO2 03/07/18 1844 100 %     Weight --      Height --      Head Circumference --      Peak Flow --      Pain Score 03/07/18 1845 10     Pain Loc --      Pain Edu? --      Excl. in GC? --    No data found.  Updated Vital Signs BP (!) 138/91 (BP Location: Right Arm)   Pulse 78   Temp 98.6 F (37 C) (Oral)   Resp 18   SpO2 100%   Visual Acuity Right Eye Distance:   Left Eye Distance:   Bilateral Distance:    Right Eye Near:   Left Eye Near:    Bilateral Near:     Physical Exam Gen: NAD, alert, cooperative with exam, well-appearing ENT: normal lips, normal nasal mucosa,  Eye: normal EOM, normal conjunctiva and lids CV:  no edema, +2 pedal pulses  Resp: no accessory muscle use, non-labored,  Skin: no rashes, no areas of induration  Neuro: normal tone, normal sensation to touch Psych:  normal insight, alert and oriented MSK:  Left foot: Redness and swelling and warmth of the left first MTP joint. Limited range of motion in flexion extension secondary to pain. No streaking. No fluctuance. Neurovascularly intact  Independent review of the left foot x-ray was normal.  UC Treatments / Results  Labs (all labs ordered are listed, but only abnormal results are displayed) Labs Reviewed - No data to display  EKG None  Radiology No results found.  Procedures Procedures (including critical care time)  Medications Ordered in UC Medications - No data to display  Initial Impression / Assessment and Plan / UC Course  I have reviewed the triage vital signs and the nursing notes.  Pertinent labs & imaging results that were available  during my care of the patient were reviewed by me and considered in my medical decision making (see chart for details).     Lucianne MussKumar is a 53 year old male that is presenting with left first MTP joint pain.  Symptom are most consistent with gout.  X-ray did not demonstrate any changes.  Less likely for infection.  Will provide with IM Depo-Medrol today.  Also given a prednisone taper.  If symptoms or not improving will consider an anti-inflammatory course.  Would consider obtaining a uric acid at some point.  Given indications to follow-up.  Final Clinical Impressions(s) / UC Diagnoses   Final diagnoses:  None   Discharge Instructions   None    ED Prescriptions    None     Controlled Substance Prescriptions [place] Controlled Substance Registry consulted? Not Applicable   Myra RudeSchmitz, Rhydian Baldi E, MD 03/07/18 2027

## 2018-03-07 NOTE — Discharge Instructions (Addendum)
Please take the medicine  Please follow up if your symptoms fail to improve

## 2018-03-07 NOTE — ED Triage Notes (Signed)
Pt here with left great toe pain and redness

## 2018-04-12 ENCOUNTER — Ambulatory Visit (HOSPITAL_COMMUNITY)
Admission: EM | Admit: 2018-04-12 | Discharge: 2018-04-12 | Disposition: A | Discharge status: Home / Self Care | Payer: 59 | Attending: Family Medicine | Admitting: Family Medicine

## 2018-04-12 ENCOUNTER — Other Ambulatory Visit: Payer: Self-pay

## 2018-04-12 ENCOUNTER — Encounter (HOSPITAL_COMMUNITY): Payer: Self-pay

## 2018-04-12 DIAGNOSIS — L509 Urticaria, unspecified: Secondary | ICD-10-CM | POA: Diagnosis not present

## 2018-04-12 MED ORDER — METHYLPREDNISOLONE 4 MG PO TBPK: ORAL_TABLET | ORAL | 0 refills | Status: DC

## 2018-04-12 MED ORDER — HYDROXYZINE HCL 25 MG PO TABS: 25.0000 mg | ORAL_TABLET | ORAL | 0 refills | Status: DC | PRN

## 2018-04-12 MED ORDER — DIPHENHYDRAMINE HCL 25 MG PO CAPS
ORAL_CAPSULE | ORAL | Status: AC
  Filled 2018-04-12: qty 2

## 2018-04-12 MED ORDER — METHYLPREDNISOLONE ACETATE 80 MG/ML IJ SUSP
80.0000 mg | Freq: Once | INTRAMUSCULAR | Status: AC
  Administered 2018-04-12: 80 mg via INTRAMUSCULAR

## 2018-04-12 MED ORDER — DIPHENHYDRAMINE HCL 25 MG PO CAPS
50.0000 mg | ORAL_CAPSULE | Freq: Four times a day (QID) | ORAL | Status: DC | PRN
  Administered 2018-04-12: 50 mg via ORAL

## 2018-04-12 MED ORDER — METHYLPREDNISOLONE ACETATE 80 MG/ML IJ SUSP
INTRAMUSCULAR | Status: AC
  Filled 2018-04-12: qty 1

## 2018-04-12 NOTE — ED Provider Notes (Signed)
[facility]    CSN: [id number] Arrival date & time: 04/12/18  1613     History   Chief Complaint Chief Complaint  Patient presents with  . Rash    HPI Cody Ross is a 53 y.o. male.   HPI  Rash "all over" Started last night Spares palms and soles No SOB or wheeze No oral lesions Cannot think of anything new exposed to Had a course of keflex over month ago   History reviewed. No pertinent past medical history.  There are no active problems to display for this patient.   History reviewed. No pertinent surgical history.     Home Medications    Prior to Admission medications   Medication Sig Start Date End Date Taking? Authorizing Provider  acetaminophen (TYLENOL) 325 MG tablet Take 2 tablets (650 mg total) by mouth every 6 (six) hours as needed. 08/18/17   Derwood Kaplan, MD  hydrOXYzine (ATARAX/VISTARIL) 25 MG tablet Take 1-2 tablets (25-50 mg total) by mouth every 4 (four) hours as needed. 04/12/18   Eustace Moore, MD  ibuprofen (ADVIL,MOTRIN) 600 MG tablet Take 1 tablet (600 mg total) by mouth every 6 (six) hours as needed. 08/18/17   Derwood Kaplan, MD  methylPREDNISolone (MEDROL DOSEPAK) 4 MG TBPK tablet tad 04/12/18   Eustace Moore, MD    Family History History reviewed. No pertinent family history.  Social History Social History   Tobacco Use  . Smoking status: Never Smoker  . Smokeless tobacco: Never Used  Substance Use Topics  . Alcohol use: Yes    Comment: occasionally  . Drug use: No     Allergies   Patient has no known allergies.   Review of Systems Review of Systems  Constitutional: Negative for chills and fever.  HENT: Negative for ear pain and sore throat.   Eyes: Negative for pain and visual disturbance.  Respiratory: Negative for cough and shortness of breath.   Cardiovascular: Negative for chest pain and palpitations.  Gastrointestinal: Negative for abdominal pain and vomiting.  Genitourinary: Negative  for dysuria and hematuria.  Musculoskeletal: Negative for arthralgias and back pain.  Skin: Positive for rash. Negative for color change.  Neurological: Negative for seizures and syncope.  All other systems reviewed and are negative.    Physical Exam Triage Vital Signs ED Triage Vitals  Enc Vitals Group     BP 04/12/18 1634 (!) 144/81     Pulse Rate 04/12/18 1634 65     Resp 04/12/18 1634 18     Temp 04/12/18 1634 98.4 F (36.9 C)     Temp src --      SpO2 04/12/18 1634 100 %     Weight 04/12/18 1635 149 lb 12.8 oz (67.9 kg)     Height --      Head Circumference --      Peak Flow --      Pain Score 04/12/18 1635 1     Pain Loc --      Pain Edu? --      Excl. in GC? --    No data found.  Updated Vital Signs BP (!) 144/81 (BP Location: Right Arm)   Pulse 65   Temp 98.4 F (36.9 C)   Resp 18   Wt 67.9 kg   SpO2 100%   BMI 26.54 kg/m       Physical Exam Constitutional:      General: He is not in acute distress.    Appearance: He  is well-developed.     Comments: No acute distress but has trouble controlling his scratching  HENT:     Head: Normocephalic and atraumatic.     Mouth/Throat:     Mouth: Mucous membranes are moist.     Pharynx: Oropharynx is clear.  Eyes:     Conjunctiva/sclera: Conjunctivae normal.     Pupils: Pupils are equal, round, and reactive to light.  Neck:     Musculoskeletal: Normal range of motion.  Cardiovascular:     Rate and Rhythm: Normal rate and regular rhythm.     Heart sounds: Normal heart sounds.  Pulmonary:     Effort: Pulmonary effort is normal. No respiratory distress.     Breath sounds: Normal breath sounds. No wheezing.  Abdominal:     General: There is no distension.     Palpations: Abdomen is soft.  Musculoskeletal: Normal range of motion.  Skin:    General: Skin is warm and dry.     Comments: 5 to 10 cm erythematous papular lesions cover scalp, neck, chest and back, arms and legs, some are excoriated  Neurological:      Mental Status: He is alert.      UC Treatments / Results  Labs (all labs ordered are listed, but only abnormal results are displayed) Labs Reviewed - No data to display  EKG None  Radiology No results found.  Procedures Procedures (including critical care time)  Medications Ordered in UC Medications  diphenhydrAMINE (BENADRYL) capsule 50 mg (50 mg Oral Given 04/12/18 1730)  methylPREDNISolone acetate (DEPO-MEDROL) injection 80 mg (80 mg Intramuscular Given 04/12/18 1731)    Initial Impression / Assessment and Plan / UC Course  I have reviewed the triage vital signs and the nursing notes.  Pertinent labs & imaging results that were available during my care of the patient were reviewed by me and considered in my medical decision making (see chart for details).    Is very difficult to tell what is causing this allergic reaction.  He is here with a.  He denies the need for an Internet interpreter.  No new food, drink, medicine, over-the-counter supplement, soap, lotion, powder I advised him that this may never come back.  If it comes back regularly, he needs allergy testing Final Clinical Impressions(s) / UC Diagnoses   Final diagnoses:  Hives     Discharge Instructions     Take the medrol pak as directed Start tomorrow This is a steroid for the allergic reaction Take the hydroxyzine for the itching Expect to start improving tonight See your PCP if you fail to improve   ED Prescriptions    Medication Sig Dispense Auth. Provider   hydrOXYzine (ATARAX/VISTARIL) 25 MG tablet Take 1-2 tablets (25-50 mg total) by mouth every 4 (four) hours as needed. 20 tablet Eustace Moore, MD   methylPREDNISolone (MEDROL DOSEPAK) 4 MG TBPK tablet tad 21 tablet Eustace Moore, MD     Controlled Substance Prescriptions [place] Controlled Substance Registry consulted? Not Applicable   Eustace Moore, MD 04/12/18 1754

## 2018-04-12 NOTE — Discharge Instructions (Addendum)
Take the medrol pak as directed Start tomorrow This is a steroid for the allergic reaction Take the hydroxyzine for the itching Expect to start improving tonight See your PCP if you fail to improve

## 2018-04-12 NOTE — ED Triage Notes (Signed)
Pt has a rash all over this started last night.

## 2018-09-09 ENCOUNTER — Ambulatory Visit (HOSPITAL_COMMUNITY)
Admission: EM | Admit: 2018-09-09 | Discharge: 2018-09-09 | Disposition: A | Discharge status: Home / Self Care | Payer: 59 | Attending: Emergency Medicine | Admitting: Emergency Medicine

## 2018-09-09 ENCOUNTER — Encounter (HOSPITAL_COMMUNITY): Payer: Self-pay

## 2018-09-09 ENCOUNTER — Ambulatory Visit (INDEPENDENT_AMBULATORY_CARE_PROVIDER_SITE_OTHER): Payer: 59

## 2018-09-09 ENCOUNTER — Other Ambulatory Visit: Payer: Self-pay

## 2018-09-09 DIAGNOSIS — M109 Gout, unspecified: Secondary | ICD-10-CM

## 2018-09-09 MED ORDER — INDOMETHACIN 50 MG PO CAPS: 50.0000 mg | ORAL_CAPSULE | ORAL | 0 refills | Status: DC

## 2018-09-09 NOTE — ED Provider Notes (Signed)
[facility]    CSN: [id number] Arrival date & time: 09/09/18  1256     History   Chief Complaint Chief Complaint  Patient presents with  . Foot Pain    left    HPI Cody Ross is a 53 y.o. male.   Patient presents with 2-day history of pain, redness, swelling in his left great toe.  Even with an interpreter, the patient is a poor historian and cannot relate whether he has had an injury or any other symptoms.    The history is provided by the patient. A language interpreter was used.    History reviewed. No pertinent past medical history.  There are no active problems to display for this patient.   History reviewed. No pertinent surgical history.     Home Medications    Prior to Admission medications   Medication Sig Start Date End Date Taking? Authorizing Provider  acetaminophen (TYLENOL) 325 MG tablet Take 2 tablets (650 mg total) by mouth every 6 (six) hours as needed. 08/18/17   Derwood KaplanNanavati, Ankit, MD  hydrOXYzine (ATARAX/VISTARIL) 25 MG tablet Take 1-2 tablets (25-50 mg total) by mouth every 4 (four) hours as needed. 04/12/18   Eustace MooreNelson, Yvonne Sue, MD  ibuprofen (ADVIL,MOTRIN) 600 MG tablet Take 1 tablet (600 mg total) by mouth every 6 (six) hours as needed. 08/18/17   Derwood KaplanNanavati, Ankit, MD  indomethacin (INDOCIN) 50 MG capsule Take 1 capsule (50 mg total) by mouth as directed. Take 1 tablet three times a day for today and tomorrow.  Then 1 tablet twice a day for 2 days.  Then 1 tablet daily for 2 days.  Take with food. 09/09/18   Mickie Bailate, Elvera Almario H, NP  methylPREDNISolone (MEDROL DOSEPAK) 4 MG TBPK tablet tad 04/12/18   Eustace MooreNelson, Yvonne Sue, MD    Family History Family History  Problem Relation Age of Onset  . Healthy Mother   . Healthy Father     Social History Social History   Tobacco Use  . Smoking status: Never Smoker  . Smokeless tobacco: Never Used  Substance Use Topics  . Alcohol use: Yes    Comment: occasionally  . Drug use: No     Allergies    Patient has no known allergies.   Review of Systems Review of Systems  Constitutional: Negative for chills and fever.  HENT: Negative for ear pain and sore throat.   Eyes: Negative for pain and visual disturbance.  Respiratory: Negative for cough and shortness of breath.   Cardiovascular: Negative for chest pain and palpitations.  Gastrointestinal: Negative for abdominal pain and vomiting.  Genitourinary: Negative for dysuria and hematuria.  Musculoskeletal: Positive for arthralgias. Negative for back pain.  Skin: Negative for color change and rash.  Neurological: Negative for seizures and syncope.  All other systems reviewed and are negative.    Physical Exam Triage Vital Signs ED Triage Vitals  Enc Vitals Group     BP 09/09/18 1356 (!) 151/83     Pulse Rate 09/09/18 1356 67     Resp 09/09/18 1356 16     Temp 09/09/18 1356 98.7 F (37.1 C)     Temp Source 09/09/18 1356 Oral     SpO2 09/09/18 1356 100 %     Weight --      Height --      Head Circumference --      Peak Flow --      Pain Score 09/09/18 1354 6     Pain Loc --  Pain Edu? --      Excl. in GC? --    No data found.  Updated Vital Signs BP (!) 151/83 (BP Location: Right Arm)   Pulse 67   Temp 98.7 F (37.1 C) (Oral)   Resp 16   SpO2 100%   Visual Acuity Right Eye Distance:   Left Eye Distance:   Bilateral Distance:    Right Eye Near:   Left Eye Near:    Bilateral Near:     Physical Exam Vitals signs and nursing note reviewed.  Constitutional:      Appearance: He is well-developed.     Comments: Exam limited by language barrier.  HENT:     Head: Normocephalic and atraumatic.  Eyes:     Conjunctiva/sclera: Conjunctivae normal.  Neck:     Musculoskeletal: Neck supple.  Cardiovascular:     Rate and Rhythm: Normal rate and regular rhythm.     Heart sounds: No murmur.  Pulmonary:     Effort: Pulmonary effort is normal. No respiratory distress.     Breath sounds: Normal breath sounds.   Abdominal:     Palpations: Abdomen is soft.     Tenderness: There is no abdominal tenderness.  Musculoskeletal:        General: Swelling and tenderness present.     Comments: Left great toe at MP joint: tender, erythematous, edematous.   Skin:    General: Skin is warm and dry.  Neurological:     Mental Status: He is alert.      UC Treatments / Results  Labs (all labs ordered are listed, but only abnormal results are displayed) Labs Reviewed - No data to display  EKG   Radiology Dg Foot Complete Left  Result Date: 09/09/2018 CLINICAL DATA:  Left foot pain following blunt trauma to the first toe, initial encounter EXAM: LEFT FOOT - COMPLETE 3+ VIEW COMPARISON:  03/07/2018 FINDINGS: There is no evidence of fracture or dislocation. There is no evidence of arthropathy or other focal bone abnormality. Soft tissues are unremarkable. IMPRESSION: No acute abnormality noted. Electronically Signed   By: Alcide CleverMark  Lukens M.D.   On: 09/09/2018 14:43    Procedures Procedures (including critical care time)  Medications Ordered in UC Medications - No data to display  Initial Impression / Assessment and Plan / UC Course  I have reviewed the triage vital signs and the nursing notes.  Pertinent labs & imaging results that were available during my care of the patient were reviewed by me and considered in my medical decision making (see chart for details).   Gout, left great toe.  Exam limited by language barrier even with use of interpreter.  Xray negative.  Treating today with indomethacin.  Instructions given that patient should return here or go to the emergency department if his pain, redness, swelling worsens; or if he develops fever, chills, or other symptoms.     Final Clinical Impressions(s) / UC Diagnoses   Final diagnoses:  Acute gout involving toe of left foot, unspecified cause     Discharge Instructions     Take the prescribed medicine as directed.    Return here or go to  the emergency department if your pain, redness, swelling worsens; or if you develop fever or chills.        ED Prescriptions    Medication Sig Dispense Auth. Provider   indomethacin (INDOCIN) 50 MG capsule Take 1 capsule (50 mg total) by mouth as directed. Take 1 tablet three times a  day for today and tomorrow.  Then 1 tablet twice a day for 2 days.  Then 1 tablet daily for 2 days.  Take with food. 12 capsule Sharion Balloon, NP     Controlled Substance Prescriptions [place] Controlled Substance Registry consulted? Not Applicable   Sharion Balloon, NP 09/09/18 1503

## 2018-09-09 NOTE — ED Triage Notes (Signed)
Patient presents to Urgent Care with complaints of left foot pain at the bast of his big toe since last night when he thinks he kicked something on accident. Patient reports redness and pain to area, is wearing post-op boot upon arrival.

## 2018-09-09 NOTE — Discharge Instructions (Signed)
Take the prescribed medicine as directed.    Return here or go to the emergency department if your pain, redness, swelling worsens; or if you develop fever or chills.

## 2018-12-19 ENCOUNTER — Other Ambulatory Visit: Payer: Self-pay

## 2018-12-19 DIAGNOSIS — I10 Essential (primary) hypertension: Secondary | ICD-10-CM | POA: Insufficient documentation

## 2018-12-19 DIAGNOSIS — Z5321 Procedure and treatment not carried out due to patient leaving prior to being seen by health care provider: Secondary | ICD-10-CM | POA: Diagnosis not present

## 2018-12-19 DIAGNOSIS — R519 Headache, unspecified: Secondary | ICD-10-CM | POA: Diagnosis present

## 2018-12-19 DIAGNOSIS — R42 Dizziness and giddiness: Secondary | ICD-10-CM | POA: Insufficient documentation

## 2018-12-19 NOTE — ED Notes (Signed)
Cody Ross [phone] son wants to be called when he can come back

## 2018-12-20 ENCOUNTER — Encounter (HOSPITAL_COMMUNITY): Payer: Self-pay | Admitting: Emergency Medicine

## 2018-12-20 ENCOUNTER — Emergency Department (HOSPITAL_COMMUNITY)
Admission: EM | Admit: 2018-12-20 | Discharge: 2018-12-21 | Disposition: A | Discharge status: Home / Self Care | Payer: 59 | Attending: Emergency Medicine | Admitting: Emergency Medicine

## 2018-12-20 ENCOUNTER — Other Ambulatory Visit: Payer: Self-pay

## 2018-12-20 LAB — CBC WITH DIFFERENTIAL/PLATELET
Abs Immature Granulocytes: 0.04 10*3/uL (ref 0.00–0.07)
Basophils Absolute: 0.1 10*3/uL (ref 0.0–0.1)
Basophils Relative: 1 %
Eosinophils Absolute: 0.1 10*3/uL (ref 0.0–0.5)
Eosinophils Relative: 1 %
HCT: 43.8 % (ref 39.0–52.0)
Hemoglobin: 14.9 g/dL (ref 13.0–17.0)
Immature Granulocytes: 0 %
Lymphocytes Relative: 14 %
Lymphs Abs: 1.5 10*3/uL (ref 0.7–4.0)
MCH: 29.3 pg (ref 26.0–34.0)
MCHC: 34 g/dL (ref 30.0–36.0)
MCV: 86.2 fL (ref 80.0–100.0)
Monocytes Absolute: 0.9 10*3/uL (ref 0.1–1.0)
Monocytes Relative: 9 %
Neutro Abs: 8.1 10*3/uL — ABNORMAL HIGH (ref 1.7–7.7)
Neutrophils Relative %: 75 %
Platelets: 204 10*3/uL (ref 150–400)
RBC: 5.08 MIL/uL (ref 4.22–5.81)
RDW: 13.2 % (ref 11.5–15.5)
WBC: 10.7 10*3/uL — ABNORMAL HIGH (ref 4.0–10.5)
nRBC: 0 % (ref 0.0–0.2)

## 2018-12-20 LAB — URINALYSIS, MICROSCOPIC (REFLEX)

## 2018-12-20 LAB — COMPREHENSIVE METABOLIC PANEL
ALT: 44 U/L (ref 0–44)
AST: 41 U/L (ref 15–41)
Albumin: 4.3 g/dL (ref 3.5–5.0)
Alkaline Phosphatase: 86 U/L (ref 38–126)
Anion gap: 13 (ref 5–15)
BUN: 12 mg/dL (ref 6–20)
CO2: 22 mmol/L (ref 22–32)
Calcium: 9.3 mg/dL (ref 8.9–10.3)
Chloride: 101 mmol/L (ref 98–111)
Creatinine, Ser: 0.82 mg/dL (ref 0.61–1.24)
GFR calc Af Amer: >60 mL/min (ref >60)
GFR calc non Af Amer: >60 mL/min (ref >60)
Glucose, Bld: 115 mg/dL — ABNORMAL HIGH (ref 70–99)
Potassium: 3.3 mmol/L — ABNORMAL LOW (ref 3.5–5.1)
Sodium: 136 mmol/L (ref 135–145)
Total Bilirubin: 0.8 mg/dL (ref 0.3–1.2)
Total Protein: 7.7 g/dL (ref 6.5–8.1)

## 2018-12-20 LAB — URINALYSIS, ROUTINE W REFLEX MICROSCOPIC
Bilirubin Urine: NEGATIVE
Glucose, UA: NEGATIVE mg/dL
Ketones, ur: NEGATIVE mg/dL
Nitrite: NEGATIVE
Protein, ur: NEGATIVE mg/dL
Specific Gravity, Urine: 1.01 (ref 1.005–1.030)
pH: 7 (ref 5.0–8.0)

## 2018-12-20 NOTE — ED Triage Notes (Signed)
Patient reports headache/dizziness and hypertension  this afternoon. Nepali interpreter services utilized at triage .

## 2018-12-20 NOTE — ED Notes (Signed)
Call z

## 2019-01-03 ENCOUNTER — Other Ambulatory Visit: Payer: Self-pay

## 2019-01-03 DIAGNOSIS — Z20822 Contact with and (suspected) exposure to covid-19: Secondary | ICD-10-CM

## 2019-01-05 LAB — NOVEL CORONAVIRUS, NAA: SARS-CoV-2, NAA: DETECTED — AB

## 2019-01-10 ENCOUNTER — Telehealth: Payer: Self-pay | Admitting: *Deleted

## 2019-01-10 NOTE — Telephone Encounter (Signed)
Attempted to call patient re: COVID results but no answer. Unable to leave voicemail message.

## 2019-01-24 ENCOUNTER — Encounter (HOSPITAL_COMMUNITY): Payer: Self-pay

## 2019-01-24 ENCOUNTER — Ambulatory Visit (HOSPITAL_COMMUNITY)
Admission: EM | Admit: 2019-01-24 | Discharge: 2019-01-24 | Disposition: A | Discharge status: Home / Self Care | Payer: 59 | Attending: Family Medicine | Admitting: Family Medicine

## 2019-01-24 ENCOUNTER — Other Ambulatory Visit: Payer: Self-pay

## 2019-01-24 DIAGNOSIS — G4489 Other headache syndrome: Secondary | ICD-10-CM

## 2019-01-24 DIAGNOSIS — I10 Essential (primary) hypertension: Secondary | ICD-10-CM

## 2019-01-24 MED ORDER — KETOROLAC TROMETHAMINE 30 MG/ML IJ SOLN
INTRAMUSCULAR | Status: AC
  Filled 2019-01-24: qty 1

## 2019-01-24 MED ORDER — BUTALBITAL-ASPIRIN-CAFFEINE 50-325-40 MG PO CAPS: 2.0000 | ORAL_CAPSULE | Freq: Two times a day (BID) | ORAL | 0 refills | Status: DC | PRN

## 2019-01-24 MED ORDER — KETOROLAC TROMETHAMINE 30 MG/ML IJ SOLN
30.0000 mg | Freq: Once | INTRAMUSCULAR | Status: AC
  Administered 2019-01-24: 20:00:00 30 mg via INTRAMUSCULAR

## 2019-01-24 MED ORDER — IRBESARTAN 75 MG PO TABS: 75.0000 mg | ORAL_TABLET | Freq: Every day | ORAL | 0 refills | Status: DC
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
## 2019-01-24 NOTE — ED Provider Notes (Addendum)
[facility]    CSN: [id number] Arrival date & time: 01/24/19  6789      History   Chief Complaint Chief Complaint  Patient presents with  . Migraine    HPI Cody Ross is a 53 y.o. male.   Patient speaks Nepali and his niece is Optometrist.  He has had a headache for about 15 days.  It is sometimes pounding.  It is not localizing to either side.  There is no associated nausea or vomiting.  He has taken some unknown headache pills from [facility].  HPI  History reviewed. No pertinent past medical history.  There are no active problems to display for this patient.   History reviewed. No pertinent surgical history.     Home Medications    Prior to Admission medications   Medication Sig Start Date End Date Taking? Authorizing Provider  acetaminophen (TYLENOL) 325 MG tablet Take 2 tablets (650 mg total) by mouth every 6 (six) hours as needed. 08/18/17   Varney Biles, MD  hydrOXYzine (ATARAX/VISTARIL) 25 MG tablet Take 1-2 tablets (25-50 mg total) by mouth every 4 (four) hours as needed. 04/12/18   Raylene Everts, MD  ibuprofen (ADVIL,MOTRIN) 600 MG tablet Take 1 tablet (600 mg total) by mouth every 6 (six) hours as needed. 08/18/17   Varney Biles, MD  indomethacin (INDOCIN) 50 MG capsule Take 1 capsule (50 mg total) by mouth as directed. Take 1 tablet three times a day for today and tomorrow.  Then 1 tablet twice a day for 2 days.  Then 1 tablet daily for 2 days.  Take with food. 09/09/18   Sharion Balloon, NP  methylPREDNISolone (MEDROL DOSEPAK) 4 MG TBPK tablet tad 04/12/18   Raylene Everts, MD    Family History Family History  Problem Relation Age of Onset  . Healthy Mother   . Healthy Father     Social History Social History   Tobacco Use  . Smoking status: Never Smoker  . Smokeless tobacco: Never Used  Substance Use Topics  . Alcohol use: Yes    Comment: occasionally  . Drug use: No     Allergies   Patient has no known allergies.    Review of Systems Review of Systems  Gastrointestinal: Negative for nausea and vomiting.  Neurological: Positive for dizziness and headaches.  All other systems reviewed and are negative.    Physical Exam Triage Vital Signs ED Triage Vitals  Enc Vitals Group     BP 01/24/19 1923 (!) 158/97     Pulse Rate 01/24/19 1923 75     Resp 01/24/19 1923 16     Temp 01/24/19 1923 98.6 F (37 C)     Temp Source 01/24/19 1923 Oral     SpO2 01/24/19 1923 100 %     Weight --      Height --      Head Circumference --      Peak Flow --      Pain Score 01/24/19 1930 8     Pain Loc --      Pain Edu? --      Excl. in [place]? --    No data found.  Updated Vital Signs BP (!) 158/97 (BP Location: Right Arm)   Pulse 75   Temp 98.6 F (37 C) (Oral)   Resp 16   SpO2 100%   Visual Acuity Right Eye Distance:   Left Eye Distance:   Bilateral Distance:    Right Eye Near:  Left Eye Near:    Bilateral Near:     Physical Exam Vitals signs and nursing note reviewed.  Constitutional:      Appearance: He is normal weight.  HENT:     Head: Normocephalic and atraumatic.     Right Ear: Tympanic membrane normal.     Left Ear: Tympanic membrane normal.     Mouth/Throat:     Mouth: Mucous membranes are moist.     Pharynx: Oropharynx is clear.  Eyes:     Pupils: Pupils are equal, round, and reactive to light.  Neck:     Musculoskeletal: Normal range of motion and neck supple.  Cardiovascular:     Rate and Rhythm: Normal rate and regular rhythm.  Pulmonary:     Effort: Pulmonary effort is normal.     Breath sounds: Normal breath sounds.  Neurological:     General: No focal deficit present.     Mental Status: He is oriented to person, place, and time.      UC Treatments / Results  Labs (all labs ordered are listed, but only abnormal results are displayed) Labs Reviewed - No data to display  EKG   Radiology No results found.  Procedures Procedures (including critical care time)   Medications Ordered in UC Medications  ketorolac (TORADOL) 30 MG/ML injection 30 mg (30 mg Intramuscular Given 01/24/19 2008)  ketorolac (TORADOL) 30 MG/ML injection (has no administration in time range)    Initial Impression / Assessment and Plan / UC Course  I have reviewed the triage vital signs and the nursing notes.  Pertinent labs & imaging results that were available during my care of the patient were reviewed by me and considered in my medical decision making (see chart for details).     Headache syndrome; poswsibly related to BP Final Clinical Impressions(s) / UC Diagnoses   Final diagnoses:  None   Discharge Instructions   None    ED Prescriptions    None     PDMP not reviewed this encounter.   Frederica Kuster, MD 01/24/19 2012    Frederica Kuster, MD 01/24/19 2014

## 2019-02-20 ENCOUNTER — Other Ambulatory Visit: Payer: Self-pay | Admitting: Family Medicine

## 2019-06-01 ENCOUNTER — Other Ambulatory Visit: Payer: Self-pay

## 2019-06-01 ENCOUNTER — Ambulatory Visit (HOSPITAL_COMMUNITY)
Admission: EM | Admit: 2019-06-01 | Discharge: 2019-06-01 | Disposition: A | Discharge status: Home / Self Care | Payer: BLUE CROSS/BLUE SHIELD | Attending: Family Medicine | Admitting: Family Medicine

## 2019-06-01 ENCOUNTER — Encounter (HOSPITAL_COMMUNITY): Payer: Self-pay

## 2019-06-01 DIAGNOSIS — M109 Gout, unspecified: Secondary | ICD-10-CM

## 2019-06-01 DIAGNOSIS — M79672 Pain in left foot: Secondary | ICD-10-CM

## 2019-06-01 MED ORDER — INDOMETHACIN 50 MG PO CAPS: 50.0000 mg | ORAL_CAPSULE | ORAL | 0 refills | Status: DC

## 2019-06-01 MED ORDER — METHYLPREDNISOLONE 4 MG PO TBPK: ORAL_TABLET | ORAL | 0 refills | Status: DC

## 2019-06-01 NOTE — Discharge Instructions (Addendum)
You are having a gout flare up.  I have sent in indomethacin and a steroid taper for you to take.   If you are not feeling better by Monday, follow up with this office or with primary care as needed.

## 2019-06-01 NOTE — ED Triage Notes (Addendum)
Pt is here with left leg pain/ swelling that started this morning. Pt has taken Advil to relieve discomfort.

## 2019-06-01 NOTE — ED Provider Notes (Signed)
[facility]    CSN: [id number] Arrival date & time: 06/01/19  1831      History   Chief Complaint Chief Complaint  Patient presents with  . Leg Pain    left    HPI Cody Ross is a 54 y.o. male.   Patient presents with symptoms visit today.  Patient speaks Nepali, declines interpreter.  Wishes to use his son as an Equities trader.  Son states that the patient woke up this morning with a painful, tender, red and swollen joints just under his left great toe.  Son reports that patient has history of gout.  Reports that he is out  of his gout medication.  Reports that he gave his dad to Advil this morning with little relief.  Per chart review, patient has no significant medical history but there is no medication of indomethacin listed.  Patient denies headache, cough, shortness of breath, chest pain, abdominal pain, nausea, vomiting, diarrhea, chills, body aches, rash, fever, other symptoms.  ROS Per HPI  The history is provided by the patient and a relative.    History reviewed. No pertinent past medical history.  There are no problems to display for this patient.   History reviewed. No pertinent surgical history.     Home Medications    Prior to Admission medications   Medication Sig Start Date End Date Taking? Authorizing Provider  acetaminophen (TYLENOL) 325 MG tablet Take 2 tablets (650 mg total) by mouth every 6 (six) hours as needed. 08/18/17   Derwood Kaplan, MD  butalbital-aspirin-caffeine [facility]) 50-325-40 MG capsule Take 2 capsules by mouth 2 (two) times daily as needed for headache. 01/24/19   Frederica Kuster, MD  hydrOXYzine (ATARAX/VISTARIL) 25 MG tablet Take 1-2 tablets (25-50 mg total) by mouth every 4 (four) hours as needed. 04/12/18   Eustace Moore, MD  ibuprofen (ADVIL,MOTRIN) 600 MG tablet Take 1 tablet (600 mg total) by mouth every 6 (six) hours as needed. 08/18/17   Derwood Kaplan, MD  indomethacin (INDOCIN) 50 MG capsule Take 1 capsule  (50 mg total) by mouth as directed. Take 1 tablet three times a day for today and tomorrow.  Then 1 tablet twice a day for 2 days.  Then 1 tablet daily for 2 days.  Take with food. 06/01/19   Moshe Cipro, NP  irbesartan (AVAPRO) 75 MG tablet Take 1 tablet (75 mg total) by mouth daily. 01/24/19   Frederica Kuster, MD  methylPREDNISolone (MEDROL DOSEPAK) 4 MG TBPK tablet tad 06/01/19   Moshe Cipro, NP    Family History Family History  Problem Relation Age of Onset  . Healthy Mother   . Healthy Father     Social History Social History   Tobacco Use  . Smoking status: Never Smoker  . Smokeless tobacco: Never Used  Substance Use Topics  . Alcohol use: Yes    Comment: occasionally  . Drug use: No     Allergies   Patient has no known allergies.   Review of Systems Review of Systems   Physical Exam Triage Vital Signs ED Triage Vitals  Enc Vitals Group     BP 06/01/19 1911 (!) 148/79     Pulse Rate 06/01/19 1911 73     Resp 06/01/19 1911 19     Temp 06/01/19 1911 98.9 F (37.2 C)     Temp Source 06/01/19 1911 Oral     SpO2 06/01/19 1911 97 %     Weight 06/01/19 1918 153 lb 12.8  oz (69.8 kg)     Height --      Head Circumference --      Peak Flow --      Pain Score 06/01/19 1917 6     Pain Loc --      Pain Edu? --      Excl. in [place]? --    No data found.  Updated Vital Signs BP (!) 148/79 (BP Location: Left Arm)   Pulse 73   Temp 98.9 F (37.2 C) (Oral)   Resp 19   Wt 153 lb 12.8 oz (69.8 kg)   SpO2 97%   BMI 27.24 kg/m   Visual Acuity Right Eye Distance:   Left Eye Distance:   Bilateral Distance:    Right Eye Near:   Left Eye Near:    Bilateral Near:     Physical Exam Vitals and nursing note reviewed.  Constitutional:      General: He is not in acute distress.    Appearance: Normal appearance. He is well-developed and normal weight. He is not ill-appearing.  HENT:     Head: Normocephalic and atraumatic.     Nose: Nose normal.      Mouth/Throat:     Mouth: Mucous membranes are moist.     Pharynx: Oropharynx is clear.  Eyes:     Extraocular Movements: Extraocular movements intact.     Conjunctiva/sclera: Conjunctivae normal.     Pupils: Pupils are equal, round, and reactive to light.  Cardiovascular:     Rate and Rhythm: Normal rate and regular rhythm.     Heart sounds: Normal heart sounds. No murmur.  Pulmonary:     Effort: Pulmonary effort is normal. No respiratory distress.     Breath sounds: Normal breath sounds. No stridor. No wheezing, rhonchi or rales.  Chest:     Chest wall: No tenderness.  Abdominal:     Palpations: Abdomen is soft.     Tenderness: There is no abdominal tenderness.  Musculoskeletal:        General: Swelling (Left great toe) and tenderness (Left great toe) present.     Cervical back: Normal range of motion and neck supple.  Lymphadenopathy:     Cervical: No cervical adenopathy.  Skin:    General: Skin is warm and dry.     Capillary Refill: Capillary refill takes less than 2 seconds.  Neurological:     General: No focal deficit present.     Mental Status: He is alert and oriented to person, place, and time.  Psychiatric:        Mood and Affect: Mood normal.        Behavior: Behavior normal.        Thought Content: Thought content normal.      UC Treatments / Results  Labs (all labs ordered are listed, but only abnormal results are displayed) Labs Reviewed - No data to display  EKG   Radiology No results found.  Procedures Procedures (including critical care time)  Medications Ordered in UC Medications - No data to display  Initial Impression / Assessment and Plan / UC Course  I have reviewed the triage vital signs and the nursing notes.  Pertinent labs & imaging results that were available during my care of the patient were reviewed by me and considered in my medical decision making (see chart for details).     Gout: Presents with erythematous, swollen, very  tender to touch hallux valgus of left foot.  Patient has history of  gout in the past.  Refilled indomethacin taper as well as methylprednisolone taper.  Patient instructed to follow-up with primary care with this office if his symptoms are not relieved at the end of the steroid taper.  Patient instructed to follow-up with the ER for trouble swallowing, trouble breathing, or other concerning symptoms. Final Clinical Impressions(s) / UC Diagnoses   Final diagnoses:  Left foot pain     Discharge Instructions     You are having a gout flare up.  I have sent in indomethacin and a steroid taper for you to take.   If you are not feeling better by Monday, follow up with this office or with primary care as needed.      ED Prescriptions    Medication Sig Dispense Auth. Provider   indomethacin (INDOCIN) 50 MG capsule Take 1 capsule (50 mg total) by mouth as directed. Take 1 tablet three times a day for today and tomorrow.  Then 1 tablet twice a day for 2 days.  Then 1 tablet daily for 2 days.  Take with food. 12 capsule Moshe Cipro, NP   methylPREDNISolone (MEDROL DOSEPAK) 4 MG TBPK tablet tad 21 tablet Moshe Cipro, NP     PDMP not reviewed this encounter.   Moshe Cipro, NP 06/03/19 2150

## 2019-07-31 ENCOUNTER — Other Ambulatory Visit: Payer: Self-pay

## 2019-07-31 ENCOUNTER — Encounter (HOSPITAL_COMMUNITY): Payer: Self-pay

## 2019-07-31 ENCOUNTER — Ambulatory Visit (HOSPITAL_COMMUNITY)
Admission: EM | Admit: 2019-07-31 | Discharge: 2019-07-31 | Disposition: A | Discharge status: Home / Self Care | Payer: BC Managed Care – PPO | Attending: Family Medicine | Admitting: Family Medicine

## 2019-07-31 DIAGNOSIS — M10072 Idiopathic gout, left ankle and foot: Secondary | ICD-10-CM

## 2019-07-31 MED ORDER — INDOMETHACIN 50 MG PO CAPS: 50.0000 mg | ORAL_CAPSULE | Freq: Two times a day (BID) | ORAL | 0 refills | Status: DC

## 2019-07-31 MED ORDER — METHYLPREDNISOLONE 4 MG PO TBPK: ORAL_TABLET | ORAL | 0 refills | Status: DC

## 2019-07-31 NOTE — Discharge Instructions (Signed)
Begin Medrol Dosepak over the next 6 days-begin with 6 tabs on day 1 and decrease by 1 tablet each day until complete-take with food May supplement indomethacin as needed, may continue to use for pain in between flares as well Elevate foot, rest  Follow-up if not improving or worsening

## 2019-07-31 NOTE — ED Provider Notes (Signed)
[facility]    CSN: [id number] Arrival date & time: 07/31/19  1445      History   Chief Complaint Chief Complaint  Patient presents with  . Foot Pain    Left    HPI Cody Ross is a 54 y.o. male history of gout presenting today for evaluation of left foot pain.  Over the past 3 days has had increased pain and swelling to the base of his left great toe.  For similar to prior gout flares.  Has previously improved with indomethacin and steroids.  He denies any injury or trauma.  HPI  History reviewed. No pertinent past medical history.  There are no problems to display for this patient.   History reviewed. No pertinent surgical history.     Home Medications    Prior to Admission medications   Medication Sig Start Date End Date Taking? Authorizing Provider  acetaminophen (TYLENOL) 325 MG tablet Take 2 tablets (650 mg total) by mouth every 6 (six) hours as needed. 08/18/17   Derwood Kaplan, MD  butalbital-aspirin-caffeine [facility]) 50-325-40 MG capsule Take 2 capsules by mouth 2 (two) times daily as needed for headache. 01/24/19   Frederica Kuster, MD  hydrOXYzine (ATARAX/VISTARIL) 25 MG tablet Take 1-2 tablets (25-50 mg total) by mouth every 4 (four) hours as needed. 04/12/18   Eustace Moore, MD  ibuprofen (ADVIL,MOTRIN) 600 MG tablet Take 1 tablet (600 mg total) by mouth every 6 (six) hours as needed. 08/18/17   Derwood Kaplan, MD  indomethacin (INDOCIN) 50 MG capsule Take 1 capsule (50 mg total) by mouth 2 (two) times daily with a meal. 07/31/19   Naomy Esham C, PA-C  irbesartan (AVAPRO) 75 MG tablet Take 1 tablet (75 mg total) by mouth daily. 01/24/19   Frederica Kuster, MD  methylPREDNISolone (MEDROL DOSEPAK) 4 MG TBPK tablet Take as directed 07/31/19   Lew Dawes, PA-C    Family History Family History  Problem Relation Age of Onset  . Healthy Mother   . Healthy Father     Social History Social History   Tobacco Use  . Smoking status:  Never Smoker  . Smokeless tobacco: Never Used  Substance Use Topics  . Alcohol use: Yes    Comment: occasionally  . Drug use: No     Allergies   Patient has no known allergies.   Review of Systems Review of Systems  Constitutional: Negative for fatigue and fever.  Eyes: Negative for redness, itching and visual disturbance.  Respiratory: Negative for shortness of breath.   Cardiovascular: Negative for chest pain and leg swelling.  Gastrointestinal: Negative for nausea and vomiting.  Musculoskeletal: Positive for arthralgias, gait problem and joint swelling. Negative for myalgias.  Skin: Negative for color change, rash and wound.  Neurological: Negative for dizziness, syncope, weakness, light-headedness and headaches.     Physical Exam Triage Vital Signs ED Triage Vitals  Enc Vitals Group     BP      Pulse      Resp      Temp      Temp src      SpO2      Weight      Height      Head Circumference      Peak Flow      Pain Score      Pain Loc      Pain Edu?      Excl. in [place]?    No data found.  Updated Vital Signs BP (!) 142/90 (BP Location: Right Arm)   Pulse 73   Temp 98.3 F (36.8 C) (Oral)   Resp 18   SpO2 96%   Visual Acuity Right Eye Distance:   Left Eye Distance:   Bilateral Distance:    Right Eye Near:   Left Eye Near:    Bilateral Near:     Physical Exam Vitals and nursing note reviewed.  Constitutional:      Appearance: He is well-developed.     Comments: No acute distress  HENT:     Head: Normocephalic and atraumatic.     Nose: Nose normal.  Eyes:     Conjunctiva/sclera: Conjunctivae normal.  Cardiovascular:     Rate and Rhythm: Normal rate.  Pulmonary:     Effort: Pulmonary effort is normal. No respiratory distress.  Abdominal:     General: There is no distension.  Musculoskeletal:        General: Normal range of motion.     Cervical back: Neck supple.     Comments: Left foot: Swelling with mild erythema noted to base of left  great toe at MTP, very sensitive to light touch; nontender throughout dorsum of foot, dorsalis pedis 2+  Skin:    General: Skin is warm and dry.  Neurological:     Mental Status: He is alert and oriented to person, place, and time.      UC Treatments / Results  Labs (all labs ordered are listed, but only abnormal results are displayed) Labs Reviewed - No data to display  EKG   Radiology No results found.  Procedures Procedures (including critical care time)  Medications Ordered in UC Medications - No data to display  Initial Impression / Assessment and Plan / UC Course  I have reviewed the triage vital signs and the nursing notes.  Pertinent labs & imaging results that were available during my care of the patient were reviewed by me and considered in my medical decision making (see chart for details).     Suspect gout as cause of pain and swelling, history of similar, treating with Medrol Dosepak along with indomethacin.  Elevation.  Activity modification.  Discussed strict return precautions. Patient verbalized understanding and is agreeable with plan.  Final Clinical Impressions(s) / UC Diagnoses   Final diagnoses:  Acute idiopathic gout involving toe of left foot     Discharge Instructions     Begin Medrol Dosepak over the next 6 days-begin with 6 tabs on day 1 and decrease by 1 tablet each day until complete-take with food May supplement indomethacin as needed, may continue to use for pain in between flares as well Elevate foot, rest  Follow-up if not improving or worsening    ED Prescriptions    Medication Sig Dispense Auth. Provider   methylPREDNISolone (MEDROL DOSEPAK) 4 MG TBPK tablet Take as directed 21 tablet Billi Bright C, PA-C   indomethacin (INDOCIN) 50 MG capsule Take 1 capsule (50 mg total) by mouth 2 (two) times daily with a meal. 30 capsule Nayib Remer C, PA-C     PDMP not reviewed this encounter.   Janith Lima,  [place] 07/31/19 1548

## 2019-07-31 NOTE — ED Triage Notes (Signed)
Pt presents with left foot pain X 3 days.

## 2019-08-14 DIAGNOSIS — M109 Gout, unspecified: Secondary | ICD-10-CM | POA: Diagnosis not present

## 2019-08-14 DIAGNOSIS — R030 Elevated blood-pressure reading, without diagnosis of hypertension: Secondary | ICD-10-CM | POA: Diagnosis not present

## 2019-10-24 ENCOUNTER — Ambulatory Visit (HOSPITAL_COMMUNITY)
Admission: EM | Admit: 2019-10-24 | Discharge: 2019-10-24 | Disposition: A | Discharge status: Home / Self Care | Payer: BC Managed Care – PPO | Attending: Family Medicine | Admitting: Family Medicine

## 2019-10-24 ENCOUNTER — Other Ambulatory Visit: Payer: Self-pay

## 2019-10-24 ENCOUNTER — Encounter (HOSPITAL_COMMUNITY): Payer: Self-pay | Admitting: Emergency Medicine

## 2019-10-24 DIAGNOSIS — M25561 Pain in right knee: Secondary | ICD-10-CM

## 2019-10-24 MED ORDER — DOXYCYCLINE HYCLATE 100 MG PO CAPS: 100.0000 mg | ORAL_CAPSULE | Freq: Two times a day (BID) | ORAL | 0 refills | Status: DC

## 2019-10-24 MED ORDER — INDOMETHACIN 50 MG PO CAPS: 50.0000 mg | ORAL_CAPSULE | Freq: Two times a day (BID) | ORAL | 0 refills | Status: DC

## 2019-10-24 NOTE — ED Provider Notes (Signed)
[facility]    CSN: [id number] Arrival date & time: 10/24/19  1020      History   Chief Complaint Chief Complaint  Patient presents with  . Leg Pain    HPI Cody Ross is a 54 y.o. male.   Presenting today with son who acts as Nurse, learning disability per patient's wishes. Here with almost a week of severe right knee pain. Having redness, heat, and swelling in the joint as well. Also had a few days of right great toe redness pain and swelling during this but that is somewhat improved now. Hx of gout in multiple joints and bacterial knee effusion in this knee several years ago. Mildly febrile today and notes he's felt feverish the past few days. Denies chills, sweats, injury, recent illnesses, insect bites, recent travel. Not currently taking anything OTC and notes he's not on any chronic uric acid reduction agents. Has PCP f/u scheduled for week after next.      History reviewed. No pertinent past medical history.  There are no problems to display for this patient.   History reviewed. No pertinent surgical history.     Home Medications    Prior to Admission medications   Medication Sig Start Date End Date Taking? Authorizing Provider  acetaminophen (TYLENOL) 325 MG tablet Take 2 tablets (650 mg total) by mouth every 6 (six) hours as needed. 08/18/17   Derwood Kaplan, MD  butalbital-aspirin-caffeine [facility]) 50-325-40 MG capsule Take 2 capsules by mouth 2 (two) times daily as needed for headache. 01/24/19   Frederica Kuster, MD  doxycycline (VIBRAMYCIN) 100 MG capsule Take 1 capsule (100 mg total) by mouth 2 (two) times daily. 10/24/19   Particia Nearing, PA-C  hydrOXYzine (ATARAX/VISTARIL) 25 MG tablet Take 1-2 tablets (25-50 mg total) by mouth every 4 (four) hours as needed. 04/12/18   Eustace Moore, MD  ibuprofen (ADVIL,MOTRIN) 600 MG tablet Take 1 tablet (600 mg total) by mouth every 6 (six) hours as needed. 08/18/17   Derwood Kaplan, MD  indomethacin (INDOCIN)  50 MG capsule Take 1 capsule (50 mg total) by mouth 2 (two) times daily with a meal. 10/24/19   Particia Nearing, PA-C  irbesartan (AVAPRO) 75 MG tablet Take 1 tablet (75 mg total) by mouth daily. 01/24/19   Frederica Kuster, MD  methylPREDNISolone (MEDROL DOSEPAK) 4 MG TBPK tablet Take as directed 07/31/19   Lew Dawes, PA-C    Family History Family History  Problem Relation Age of Onset  . Healthy Mother   . Healthy Father     Social History Social History   Tobacco Use  . Smoking status: Never Smoker  . Smokeless tobacco: Never Used  Vaping Use  . Vaping Use: Never used  Substance Use Topics  . Alcohol use: Yes    Comment: occasionally  . Drug use: No     Allergies   Patient has no known allergies.   Review of Systems Review of Systems  Constitutional: Positive for fever.  HENT: Negative.   Respiratory: Negative.   Cardiovascular: Negative.   Gastrointestinal: Negative.   Genitourinary: Negative.   Musculoskeletal: Positive for arthralgias and joint swelling.  Skin: Negative.   Neurological: Negative.   Hematological: Negative.   Psychiatric/Behavioral: Negative.      Physical Exam Triage Vital Signs ED Triage Vitals  Enc Vitals Group     BP 10/24/19 1201 136/84     Pulse Rate 10/24/19 1201 71     Resp 10/24/19 1201 18  Temp 10/24/19 1201 100.2 F (37.9 C)     Temp Source 10/24/19 1201 Oral     SpO2 10/24/19 1201 100 %     Weight --      Height --      Head Circumference --      Peak Flow --      Pain Score 10/24/19 1159 9     Pain Loc --      Pain Edu? --      Excl. in [place]? --    No data found.  Updated Vital Signs BP 136/84 (BP Location: Right Arm)   Pulse 71   Temp 100.2 F (37.9 C) (Oral)   Resp 18   SpO2 100%   Visual Acuity Right Eye Distance:   Left Eye Distance:   Bilateral Distance:    Right Eye Near:   Left Eye Near:    Bilateral Near:     Physical Exam Vitals and nursing note reviewed.  Constitutional:        Appearance: Normal appearance.  HENT:     Head: Atraumatic.  Eyes:     Extraocular Movements: Extraocular movements intact.     Conjunctiva/sclera: Conjunctivae normal.  Cardiovascular:     Rate and Rhythm: Normal rate and regular rhythm.  Pulmonary:     Effort: Pulmonary effort is normal.     Breath sounds: Normal breath sounds.  Musculoskeletal:     Cervical back: Normal range of motion and neck supple.     Comments: Poor ability to perform ROM exam on right LE due to patient's discomfort with even slight movements. Antalgic gait, using crutches. Right great toe mildly ttp and swollen but overall fairly benign appearing with good ROM.   Skin:    General: Skin is warm and dry.     Findings: Erythema (edema, warmth, and severe ttp including light touch right knee) present.  Neurological:     General: No focal deficit present.  Psychiatric:        Mood and Affect: Mood normal.        Thought Content: Thought content normal.        Judgment: Judgment normal.    UC Treatments / Results  Labs (all labs ordered are listed, but only abnormal results are displayed) Labs Reviewed - No data to display  EKG   Radiology No results found.  Procedures Procedures (including critical care time)  Medications Ordered in UC Medications - No data to display  Initial Impression / Assessment and Plan / UC Course  I have reviewed the triage vital signs and the nursing notes.  Pertinent labs & imaging results that were available during my care of the patient were reviewed by me and considered in my medical decision making (see chart for details).     Suspicious for gout vs bacterial joint effusion. Will tx with indomethacin, doxycycline, voltaren gel additionally prn, and discussed tart cherry supplements. Strict return precautions reviewed if sxs not resolving. Follow up with primary care as scheduled week after next and consider starting daily preventative medication for gout given  persistence of flares. Work note given for remainder of week.   Final Clinical Impressions(s) / UC Diagnoses   Final diagnoses:  Acute pain of right knee   Discharge Instructions   None    ED Prescriptions    Medication Sig Dispense Auth. Provider   indomethacin (INDOCIN) 50 MG capsule Take 1 capsule (50 mg total) by mouth 2 (two) times daily with a meal. 30  capsule Particia Nearing, PA-C   doxycycline (VIBRAMYCIN) 100 MG capsule Take 1 capsule (100 mg total) by mouth 2 (two) times daily. 14 capsule Particia Nearing, [place]     PDMP not reviewed this encounter.   Particia Nearing, [place] 10/24/19 1256

## 2019-10-24 NOTE — ED Triage Notes (Signed)
Pt presents with leg pain xs 1 week. States the pain radiates down into foot.

## 2019-11-06 DIAGNOSIS — M109 Gout, unspecified: Secondary | ICD-10-CM | POA: Diagnosis not present

## 2019-11-06 DIAGNOSIS — R030 Elevated blood-pressure reading, without diagnosis of hypertension: Secondary | ICD-10-CM | POA: Diagnosis not present

## 2019-11-08 ENCOUNTER — Other Ambulatory Visit: Payer: Self-pay | Admitting: Family Medicine

## 2019-11-19 ENCOUNTER — Other Ambulatory Visit: Payer: Self-pay | Admitting: Family Medicine

## 2020-01-08 DIAGNOSIS — Z23 Encounter for immunization: Secondary | ICD-10-CM | POA: Diagnosis not present

## 2020-01-08 DIAGNOSIS — M109 Gout, unspecified: Secondary | ICD-10-CM | POA: Diagnosis not present

## 2020-01-08 DIAGNOSIS — R030 Elevated blood-pressure reading, without diagnosis of hypertension: Secondary | ICD-10-CM | POA: Diagnosis not present

## 2020-01-22 ENCOUNTER — Other Ambulatory Visit: Payer: Self-pay | Admitting: Family Medicine

## 2020-01-22 NOTE — Telephone Encounter (Signed)
Requested medication (s) are due for refill today: yes per interface surescripts  Requested medication (s) are on the active medication list: yes   Last refill:  10/24/19 #14 0 refills  Future visit scheduled: no  Notes to clinic:  medication not assigned a protocol, do you want to refill Rx?     Requested Prescriptions  Pending Prescriptions Disp Refills   doxycycline (VIBRAMYCIN) 100 MG capsule [Pharmacy Med Name: DOXYCYCLINE HYC 100MG  CAPS] 14 capsule 0    Sig: TAKE 1 CAPSULE(100 MG) BY MOUTH TWICE DAILY      Off-Protocol Failed - 01/22/2020  2:10 PM      Failed - Medication not assigned to a protocol, review manually.      Failed - Valid encounter within last 12 months    Recent Outpatient Visits   None

## 2020-01-22 NOTE — Telephone Encounter (Signed)
Not our patient

## 2020-02-19 DIAGNOSIS — M109 Gout, unspecified: Secondary | ICD-10-CM | POA: Diagnosis not present

## 2020-04-08 ENCOUNTER — Other Ambulatory Visit: Payer: Self-pay | Admitting: Internal Medicine

## 2020-04-08 DIAGNOSIS — R030 Elevated blood-pressure reading, without diagnosis of hypertension: Secondary | ICD-10-CM | POA: Diagnosis not present

## 2020-04-08 DIAGNOSIS — Z13220 Encounter for screening for lipoid disorders: Secondary | ICD-10-CM | POA: Diagnosis not present

## 2020-04-08 DIAGNOSIS — Z0000 Encounter for general adult medical examination without abnormal findings: Secondary | ICD-10-CM | POA: Diagnosis not present

## 2020-04-08 DIAGNOSIS — Z125 Encounter for screening for malignant neoplasm of prostate: Secondary | ICD-10-CM | POA: Diagnosis not present

## 2020-04-08 DIAGNOSIS — E559 Vitamin D deficiency, unspecified: Secondary | ICD-10-CM | POA: Diagnosis not present

## 2020-04-08 DIAGNOSIS — Z131 Encounter for screening for diabetes mellitus: Secondary | ICD-10-CM | POA: Diagnosis not present

## 2020-04-08 DIAGNOSIS — M109 Gout, unspecified: Secondary | ICD-10-CM | POA: Diagnosis not present

## 2020-04-09 LAB — COMPLETE METABOLIC PANEL WITH GFR
AG Ratio: 1.8 (calc) (ref 1.0–2.5)
ALT: 51 U/L — ABNORMAL HIGH (ref 9–46)
AST: 35 U/L (ref 10–35)
Albumin: 4.7 g/dL (ref 3.6–5.1)
Alkaline phosphatase (APISO): 101 U/L (ref 35–144)
BUN: 14 mg/dL (ref 7–25)
CO2: 26 mmol/L (ref 20–32)
Calcium: 9.6 mg/dL (ref 8.6–10.3)
Chloride: 106 mmol/L (ref 98–110)
Creat: 0.78 mg/dL (ref 0.70–1.33)
GFR, Est African American: 118 mL/min/{1.73_m2} (ref >=60)
GFR, Est Non African American: 102 mL/min/{1.73_m2} (ref >=60)
Globulin: 2.6 g/dL (calc) (ref 1.9–3.7)
Glucose, Bld: 105 mg/dL — ABNORMAL HIGH (ref 65–99)
Potassium: 4.4 mmol/L (ref 3.5–5.3)
Sodium: 142 mmol/L (ref 135–146)
Total Bilirubin: 0.8 mg/dL (ref 0.2–1.2)
Total Protein: 7.3 g/dL (ref 6.1–8.1)

## 2020-04-09 LAB — PSA: PSA: 0.33 ng/mL (ref <=4.0)

## 2020-04-09 LAB — CBC
HCT: 45.4 % (ref 38.5–50.0)
Hemoglobin: 15 g/dL (ref 13.2–17.1)
MCH: 29.8 pg (ref 27.0–33.0)
MCHC: 33 g/dL (ref 32.0–36.0)
MCV: 90.3 fL (ref 80.0–100.0)
MPV: 13.4 fL — ABNORMAL HIGH (ref 7.5–12.5)
Platelets: 201 10*3/uL (ref 140–400)
RBC: 5.03 10*6/uL (ref 4.20–5.80)
RDW: 13 % (ref 11.0–15.0)
WBC: 6.6 10*3/uL (ref 3.8–10.8)

## 2020-04-09 LAB — URIC ACID: Uric Acid, Serum: 5.9 mg/dL (ref 4.0–8.0)

## 2020-04-09 LAB — LIPID PANEL
Cholesterol: 184 mg/dL (ref <200)
HDL: 64 mg/dL (ref >=40)
LDL Cholesterol (Calc): 83 mg/dL (calc)
Non-HDL Cholesterol (Calc): 120 mg/dL (calc) (ref <130)
Total CHOL/HDL Ratio: 2.9 (calc) (ref <5.0)
Triglycerides: 278 mg/dL — ABNORMAL HIGH (ref <150)

## 2020-04-09 LAB — VITAMIN D 25 HYDROXY (VIT D DEFICIENCY, FRACTURES): Vit D, 25-Hydroxy: 18 ng/mL — ABNORMAL LOW (ref 30–100)

## 2020-10-09 DIAGNOSIS — M109 Gout, unspecified: Secondary | ICD-10-CM | POA: Diagnosis not present

## 2020-10-09 DIAGNOSIS — R030 Elevated blood-pressure reading, without diagnosis of hypertension: Secondary | ICD-10-CM | POA: Diagnosis not present

## 2020-11-11 DIAGNOSIS — L209 Atopic dermatitis, unspecified: Secondary | ICD-10-CM | POA: Diagnosis not present

## 2020-11-14 DIAGNOSIS — L209 Atopic dermatitis, unspecified: Secondary | ICD-10-CM | POA: Diagnosis not present

## 2020-11-14 DIAGNOSIS — M109 Gout, unspecified: Secondary | ICD-10-CM | POA: Diagnosis not present

## 2020-11-18 DIAGNOSIS — L259 Unspecified contact dermatitis, unspecified cause: Secondary | ICD-10-CM | POA: Diagnosis not present

## 2020-11-18 DIAGNOSIS — M109 Gout, unspecified: Secondary | ICD-10-CM | POA: Diagnosis not present

## 2020-11-19 DIAGNOSIS — R202 Paresthesia of skin: Secondary | ICD-10-CM | POA: Diagnosis not present

## 2020-11-19 DIAGNOSIS — M109 Gout, unspecified: Secondary | ICD-10-CM | POA: Diagnosis not present

## 2020-11-19 DIAGNOSIS — L259 Unspecified contact dermatitis, unspecified cause: Secondary | ICD-10-CM | POA: Diagnosis not present

## 2020-11-26 DIAGNOSIS — M109 Gout, unspecified: Secondary | ICD-10-CM | POA: Diagnosis not present

## 2020-11-26 DIAGNOSIS — M79641 Pain in right hand: Secondary | ICD-10-CM | POA: Diagnosis not present

## 2020-12-03 DIAGNOSIS — R202 Paresthesia of skin: Secondary | ICD-10-CM | POA: Diagnosis not present

## 2020-12-03 DIAGNOSIS — M109 Gout, unspecified: Secondary | ICD-10-CM | POA: Diagnosis not present

## 2020-12-03 DIAGNOSIS — J302 Other seasonal allergic rhinitis: Secondary | ICD-10-CM | POA: Diagnosis not present

## 2020-12-11 DIAGNOSIS — R202 Paresthesia of skin: Secondary | ICD-10-CM | POA: Diagnosis not present

## 2020-12-11 DIAGNOSIS — M109 Gout, unspecified: Secondary | ICD-10-CM | POA: Diagnosis not present

## 2020-12-11 DIAGNOSIS — E559 Vitamin D deficiency, unspecified: Secondary | ICD-10-CM | POA: Diagnosis not present

## 2020-12-20 DIAGNOSIS — R071 Chest pain on breathing: Secondary | ICD-10-CM | POA: Diagnosis not present

## 2020-12-20 DIAGNOSIS — G4700 Insomnia, unspecified: Secondary | ICD-10-CM | POA: Diagnosis not present

## 2020-12-20 DIAGNOSIS — R202 Paresthesia of skin: Secondary | ICD-10-CM | POA: Diagnosis not present

## 2020-12-20 DIAGNOSIS — M109 Gout, unspecified: Secondary | ICD-10-CM | POA: Diagnosis not present

## 2020-12-22 IMAGING — DX DG FOOT COMPLETE 3+V*L*
3 series · 3 of 3 positions shown · non-contrast
Comparison: 08/14/2016

CLINICAL DATA: Pain and redness to left great toe

EXAM:
LEFT FOOT - COMPLETE 3+ VIEW

[foot ap]
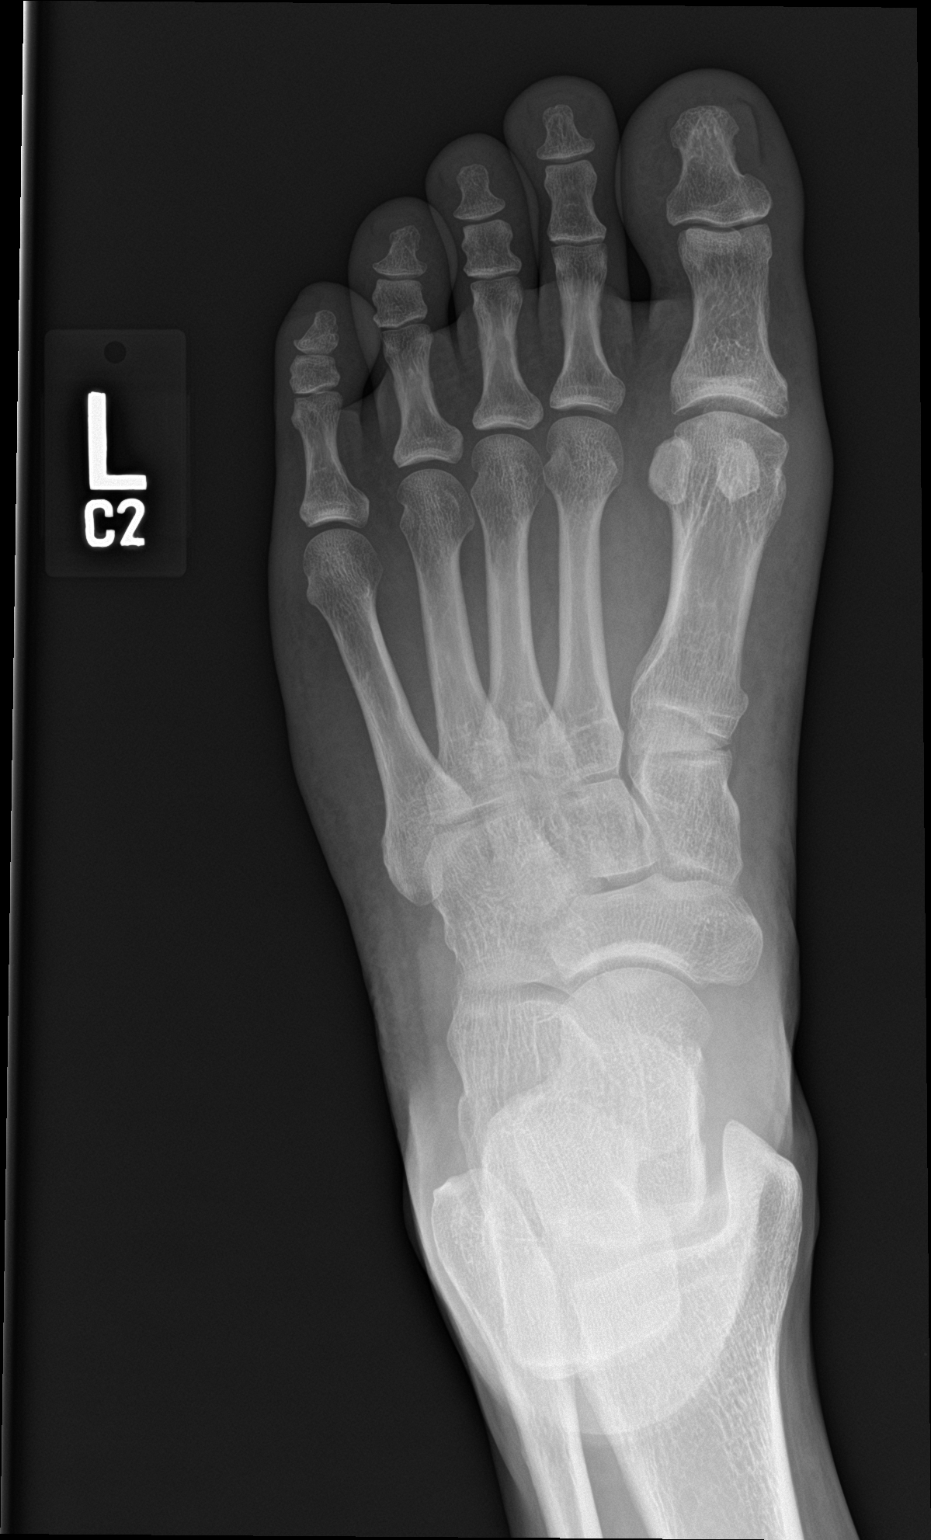

[foot obl]
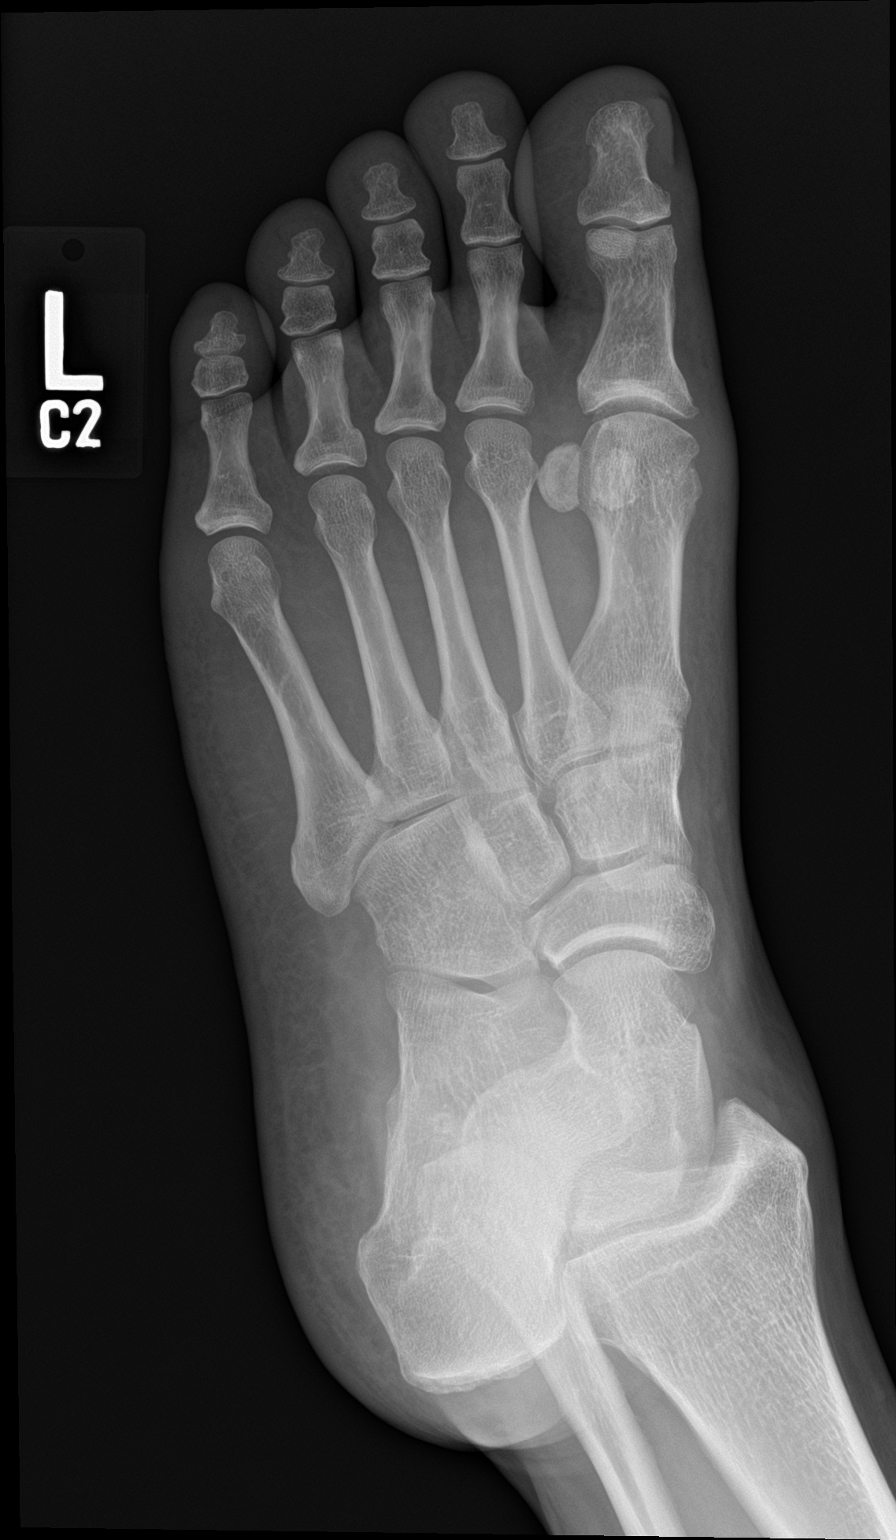

[foot lat]
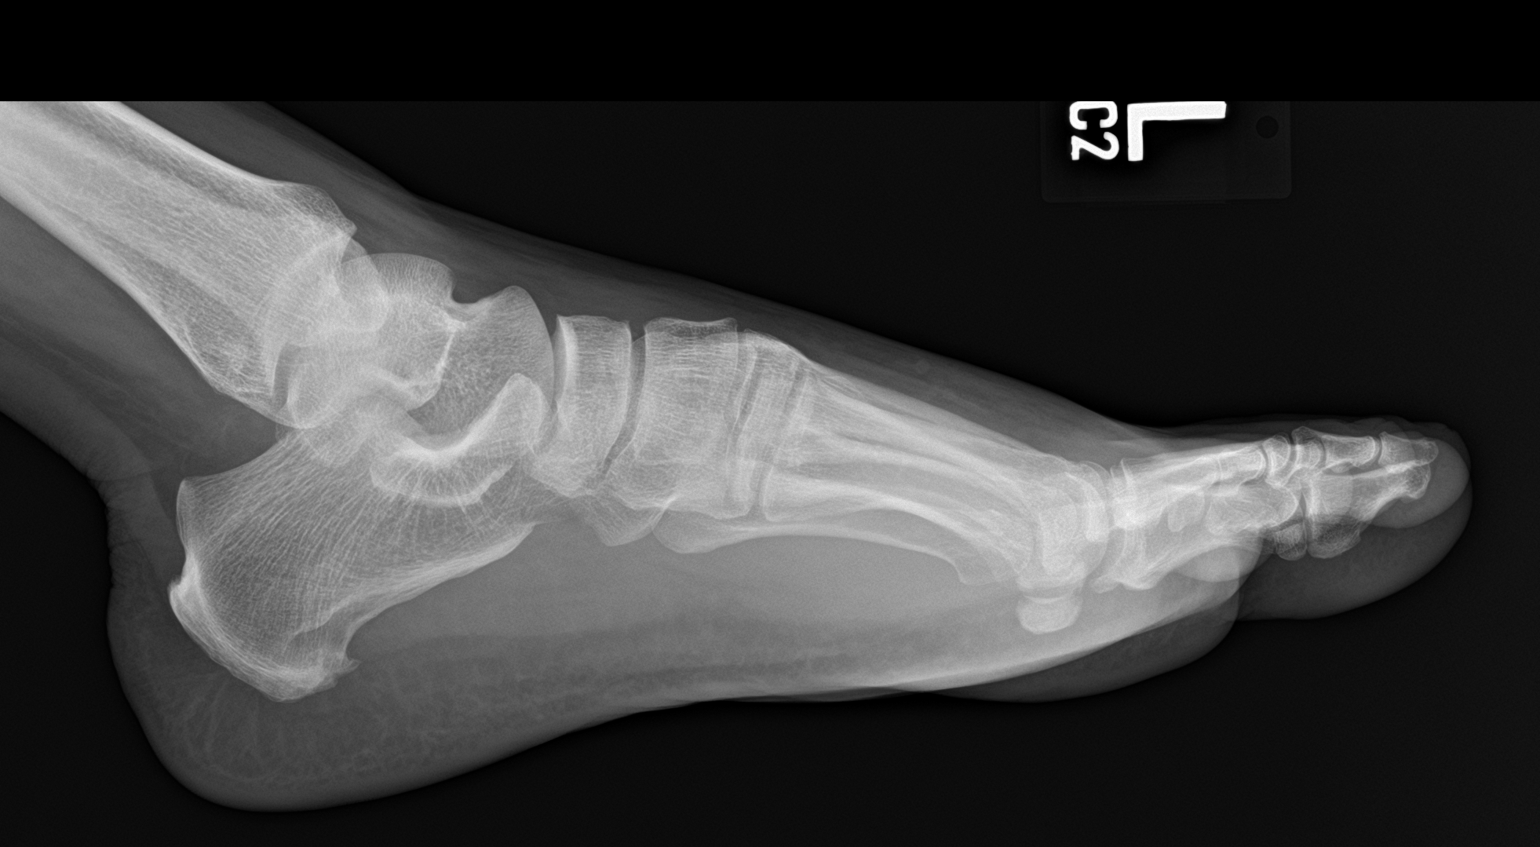

[3 of 3 positions shown; findings below may reference images not displayed]

FINDINGS: No fracture or dislocation is seen.

The joint spaces are preserved.

The visualized soft tissues are unremarkable.

No cortical erosions or irregularity involving the 1st digit.
IMPRESSION: No acute abnormality is seen.

## 2021-01-29 DIAGNOSIS — R202 Paresthesia of skin: Secondary | ICD-10-CM | POA: Diagnosis not present

## 2021-02-03 DIAGNOSIS — R202 Paresthesia of skin: Secondary | ICD-10-CM | POA: Diagnosis not present

## 2021-02-03 DIAGNOSIS — M109 Gout, unspecified: Secondary | ICD-10-CM | POA: Diagnosis not present

## 2021-02-03 DIAGNOSIS — L200 Besnier's prurigo: Secondary | ICD-10-CM | POA: Diagnosis not present

## 2021-02-03 DIAGNOSIS — G4700 Insomnia, unspecified: Secondary | ICD-10-CM | POA: Diagnosis not present

## 2021-06-26 IMAGING — DX LEFT FOOT - COMPLETE 3+ VIEW
3 series · 3 of 3 positions shown · non-contrast
Comparison: 03/07/2018

CLINICAL DATA: Left foot pain following blunt trauma to the first
toe, initial encounter

EXAM:
LEFT FOOT - COMPLETE 3+ VIEW

[foot ap]
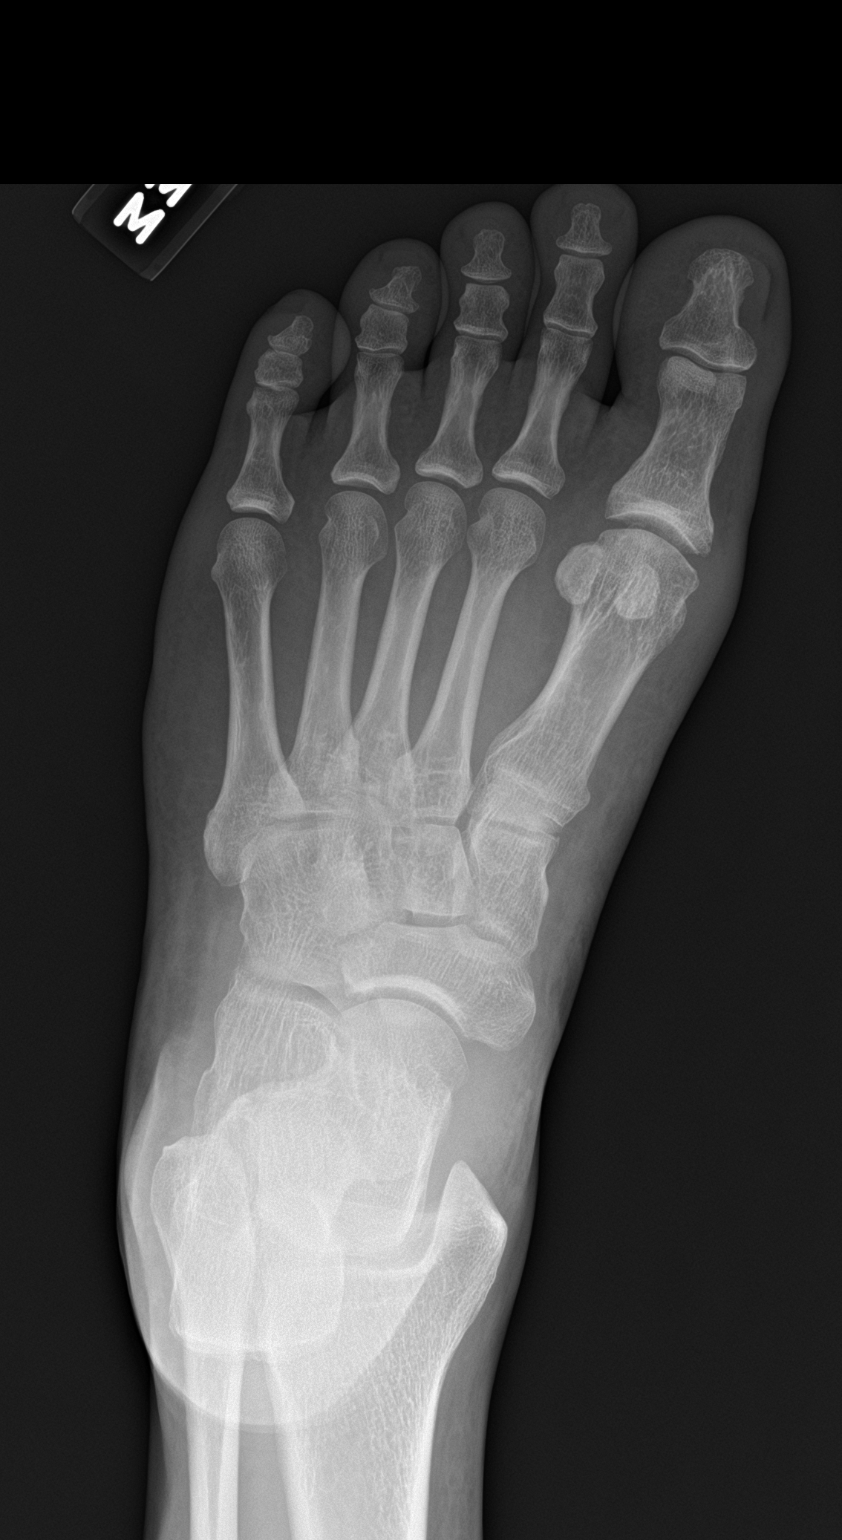

[foot obl]
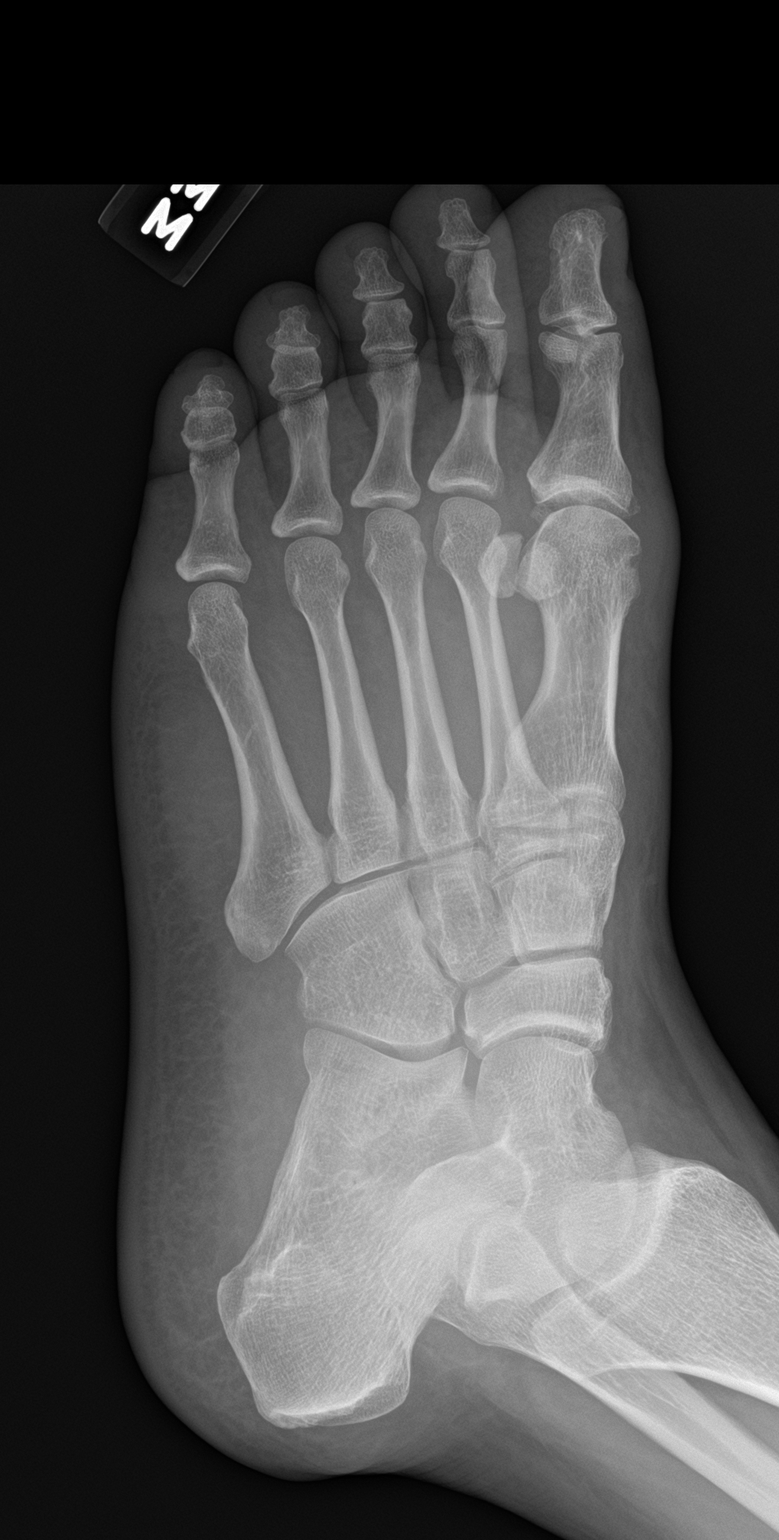

[foot lat]
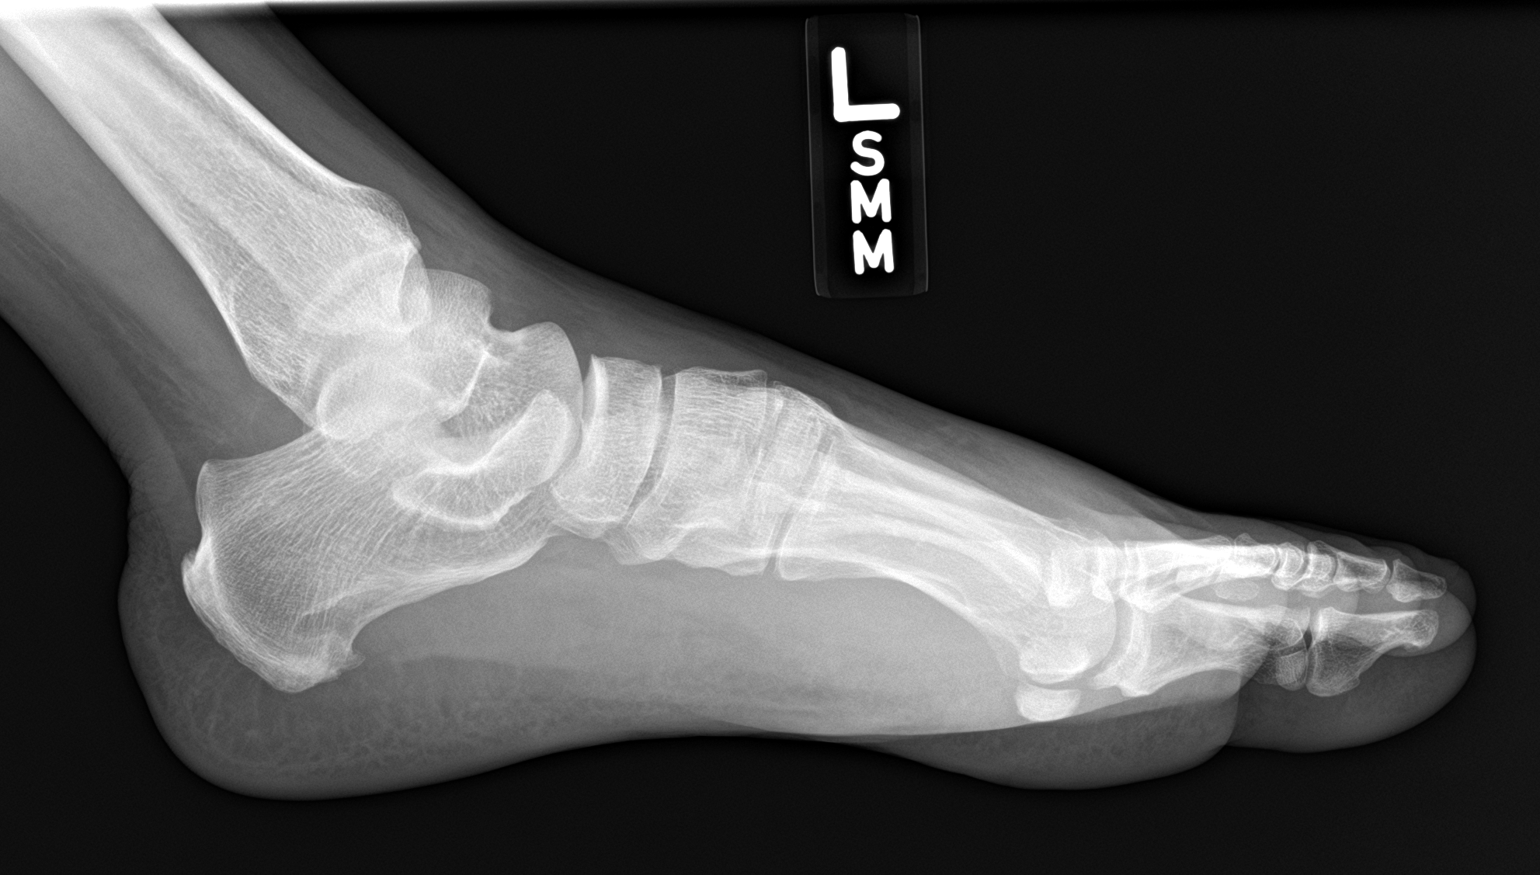

[3 of 3 positions shown; findings below may reference images not displayed]

FINDINGS: There is no evidence of fracture or dislocation. There is no
evidence of arthropathy or other focal bone abnormality. Soft
tissues are unremarkable.
IMPRESSION: No acute abnormality noted.

## 2023-05-12 ENCOUNTER — Encounter: Payer: Self-pay | Admitting: Nurse Practitioner

## 2023-05-12 ENCOUNTER — Ambulatory Visit (INDEPENDENT_AMBULATORY_CARE_PROVIDER_SITE_OTHER): Payer: Self-pay | Admitting: Nurse Practitioner

## 2023-05-12 VITALS — BP 128/73 | HR 68 | Temp 97.5°F | Wt 171.0 lb

## 2023-05-12 DIAGNOSIS — L309 Dermatitis, unspecified: Secondary | ICD-10-CM | POA: Insufficient documentation

## 2023-05-12 DIAGNOSIS — H9193 Unspecified hearing loss, bilateral: Secondary | ICD-10-CM | POA: Diagnosis not present

## 2023-05-12 DIAGNOSIS — M25561 Pain in right knee: Secondary | ICD-10-CM | POA: Diagnosis not present

## 2023-05-12 DIAGNOSIS — G8929 Other chronic pain: Secondary | ICD-10-CM

## 2023-05-12 DIAGNOSIS — Z0000 Encounter for general adult medical examination without abnormal findings: Secondary | ICD-10-CM | POA: Diagnosis not present

## 2023-05-12 DIAGNOSIS — I10 Essential (primary) hypertension: Secondary | ICD-10-CM

## 2023-05-12 MED ORDER — DICLOFENAC SODIUM 1 % EX GEL: 4.0000 g | Freq: Four times a day (QID) | CUTANEOUS | 1 refills | Status: AC

## 2023-05-12 NOTE — Patient Instructions (Signed)
 1. Chronic pain of right knee (Primary)  - Ambulatory referral to Orthopedic Surgery - diclofenac Sodium (VOLTAREN ARTHRITIS PAIN) 1 % GEL; Apply 4 g topically 4 (four) times daily.  Dispense: 100 g; Refill: 1  2. Hearing impaired person, bilateral  - Ambulatory referral to ENT  3. Health care maintenance  - CMP14+EGFR - CBC      It is important that you exercise regularly at least 30 minutes 5 times a week as tolerated  Think about what you will eat, plan ahead. Choose " clean, green, fresh or frozen" over canned, processed or packaged foods which are more sugary, salty and fatty. 70 to 75% of food eaten should be vegetables and fruit. Three meals at set times with snacks allowed between meals, but they must be fruit or vegetables. Aim to eat over a 12 hour period , example 7 am to 7 pm, and STOP after  your last meal of the day. Drink water,generally about 64 ounces per day, no other drink is as healthy. Fruit juice is best enjoyed in a healthy way, by EATING the fruit.  Thanks for choosing [facility] we consider it a privelige to serve you.

## 2023-05-12 NOTE — Assessment & Plan Note (Signed)
 Continue triamcinolone 0.1% cream twice daily as needed

## 2023-05-12 NOTE — Progress Notes (Signed)
 New Patient Office Visit  Subjective:  Patient ID: Cody Ross, male    DOB: October 23, 1965  Age: 58 y.o. MRN: [id number]  CC:  Chief Complaint  Patient presents with   Establish Care    HPI Cody Ross is a 58 y.o. male  has a past medical history of Depression and Hypertension.  Patient presents to establish care for his chronic medical conditions.  He is accompanied by his son's friend and a medical interpreter who assisted with interpretation. His recent previous PCP was at [place]  Hypertension.  Currently on lisinopril 20 mg daily.  Patient denies chest pain shortness of breath edema  Depression.  Has bottles of sertraline 100 mg tablets and 50 mg tablets but states that he does not take the medication.  He denies depression today  Hearing loss of both ears.  Uses hearing aids but still has trouble hearing even with the hearing aid, the requested for referral to ENT  Chronic right knee pain patient complains of chronic right knee pain, uses a cane for ambulation.  His pain is currently 5/10.  Takes gabapentin 300 mg 3 times daily, Tylenol 650 mg every 6 hours as needed.  They are requesting for referral to orthopedics    Past Medical History:  Diagnosis Date   Depression    Hypertension     History reviewed. No pertinent surgical history.  Family History  Problem Relation Age of Onset   Healthy Mother    Healthy Father     Social History   Socioeconomic History   Marital status: Married    Spouse name: Not on file   Number of children: Not on file   Years of education: Not on file   Highest education level: Not on file  Occupational History   Not on file  Tobacco Use   Smoking status: Never   Smokeless tobacco: Never  Vaping Use   Vaping status: Never Used  Substance and Sexual Activity   Alcohol use: Yes    Comment: occasionally   Drug use: No   Sexual activity: Yes    Birth control/protection: None    Comment: Married  Other Topics Concern    Not on file  Social History Narrative   Lives with his son    Social Drivers of Corporate investment banker Strain: Not on file  Food Insecurity: Not on file  Transportation Needs: Not on file  Physical Activity: Not on file  Stress: Not on file  Social Connections: Not on file  Intimate Partner Violence: Not on file    ROS Review of Systems  Objective:   Today's Vitals: BP 128/73   Pulse 68   Temp (!) 97.5 F (36.4 C)   Wt 171 lb (77.6 kg)   SpO2 100%   BMI 30.29 kg/m   Physical Exam Vitals and nursing note reviewed.  Constitutional:      General: He is not in acute distress.    Appearance: Normal appearance. He is not ill-appearing, toxic-appearing or diaphoretic.  HENT:     Mouth/Throat:     Mouth: Mucous membranes are moist.     Pharynx: Oropharynx is clear. No oropharyngeal exudate or posterior oropharyngeal erythema.  Eyes:     General: No scleral icterus.       Right eye: No discharge.        Left eye: No discharge.     Extraocular Movements: Extraocular movements intact.     Conjunctiva/sclera: Conjunctivae normal.  Cardiovascular:  Rate and Rhythm: Normal rate and regular rhythm.     Pulses: Normal pulses.     Heart sounds: Normal heart sounds. No murmur heard.    No friction rub. No gallop.  Pulmonary:     Effort: Pulmonary effort is normal. No respiratory distress.     Breath sounds: Normal breath sounds. No stridor. No wheezing, rhonchi or rales.  Chest:     Chest wall: No tenderness.  Abdominal:     General: There is no distension.     Palpations: Abdomen is soft.     Tenderness: There is no abdominal tenderness. There is no right CVA tenderness, left CVA tenderness or guarding.  Musculoskeletal:        General: Tenderness present. No swelling, deformity or signs of injury.     Right lower leg: No edema.     Left lower leg: No edema.     Comments: Tenderness on range of motion of right knee, no redness or swelling noted, able to ambulate  using a cane  Skin:    General: Skin is warm and dry.     Capillary Refill: Capillary refill takes less than 2 seconds.     Coloration: Skin is not jaundiced or pale.     Findings: No bruising, erythema or lesion.  Neurological:     Mental Status: He is alert and oriented to person, place, and time.     Motor: No weakness.     Coordination: Coordination normal.     Gait: Gait abnormal.  Psychiatric:        Mood and Affect: Mood normal.        Behavior: Behavior normal.        Thought Content: Thought content normal.        Judgment: Judgment normal.     Assessment & Plan:   Problem List Items Addressed This Visit       Cardiovascular and Mediastinum   High blood pressure   BP Readings from Last 3 Encounters:  05/12/23 128/73  10/24/19 136/84  07/31/19 (!) 142/90   Controlled on lisinopril 20 mg daily Continue current medications. No changes in management. Discussed DASH diet and dietary sodium restrictions Continue to increase dietary efforts and exercise.         Relevant Medications   lisinopril (ZESTRIL) 20 MG tablet     Musculoskeletal and Integument   Dermatitis   Continue triamcinolone 0.1% cream twice daily as needed        Other   Chronic pain of right knee - Primary   Patient declined Toradol injection in the office today Continue gabapentin 300 mg 3 times daily - Ambulatory referral to Orthopedic Surgery - diclofenac Sodium (VOLTAREN ARTHRITIS PAIN) 1 % GEL; Apply 4 g topically 4 (four) times daily.  Dispense: 100 g; Refill: 1      Relevant Medications   diclofenac Sodium (VOLTAREN ARTHRITIS PAIN) 1 % GEL   Other Relevant Orders   Ambulatory referral to Orthopedic Surgery   Hearing impaired person, bilateral    - Ambulatory referral to ENT        Relevant Orders   Ambulatory referral to ENT   Other Visit Diagnoses       Health care maintenance       Relevant Orders   CMP14+EGFR   CBC       Outpatient Encounter Medications as of  05/12/2023  Medication Sig   acetaminophen (TYLENOL) 325 MG tablet Take 2 tablets (650 mg total) by mouth  every 6 (six) hours as needed.   cetirizine (ZYRTEC) 10 MG tablet Take 10 mg by mouth every morning.   diclofenac Sodium (VOLTAREN ARTHRITIS PAIN) 1 % GEL Apply 4 g topically 4 (four) times daily.   gabapentin (NEURONTIN) 300 MG capsule Take 300 mg by mouth 3 (three) times daily.   lisinopril (ZESTRIL) 20 MG tablet Take 20 mg by mouth daily.   triamcinolone cream (KENALOG) 0.1 % Apply topically 2 (two) times daily.   Vitamin D, Ergocalciferol, (DRISDOL) 1.25 MG (50000 UNIT) CAPS capsule Take 50,000 Units by mouth once a week.   amitriptyline (ELAVIL) 50 MG tablet Take 50 mg by mouth at bedtime. (Patient not taking: Reported on 05/12/2023)   butalbital-aspirin-caffeine [facility]) 50-325-40 MG capsule Take 2 capsules by mouth 2 (two) times daily as needed for headache. (Patient not taking: Reported on 05/12/2023)   indomethacin (INDOCIN) 50 MG capsule Take 1 capsule (50 mg total) by mouth 2 (two) times daily with a meal. (Patient not taking: Reported on 05/12/2023)   sertraline (ZOLOFT) 100 MG tablet Take 100 mg by mouth daily. (Patient not taking: Reported on 05/12/2023)   [DISCONTINUED] doxycycline (VIBRAMYCIN) 100 MG capsule Take 1 capsule (100 mg total) by mouth 2 (two) times daily. (Patient not taking: Reported on 05/12/2023)   [DISCONTINUED] hydrOXYzine (ATARAX/VISTARIL) 25 MG tablet Take 1-2 tablets (25-50 mg total) by mouth every 4 (four) hours as needed. (Patient not taking: Reported on 05/12/2023)   [DISCONTINUED] ibuprofen (ADVIL,MOTRIN) 600 MG tablet Take 1 tablet (600 mg total) by mouth every 6 (six) hours as needed. (Patient not taking: Reported on 05/12/2023)   [DISCONTINUED] irbesartan (AVAPRO) 75 MG tablet Take 1 tablet (75 mg total) by mouth daily. (Patient not taking: Reported on 05/12/2023)   [DISCONTINUED] methylPREDNISolone (MEDROL DOSEPAK) 4 MG TBPK tablet Take as directed (Patient  not taking: Reported on 05/12/2023)   No facility-administered encounter medications on file as of 05/12/2023.    Follow-up: Return in about 3 months (around 08/12/2023) for HTN.   Donell Beers, FNP

## 2023-05-12 NOTE — Assessment & Plan Note (Addendum)
 Patient declined Toradol injection in the office today Continue gabapentin 300 mg 3 times daily - Ambulatory referral to Orthopedic Surgery - diclofenac Sodium (VOLTAREN ARTHRITIS PAIN) 1 % GEL; Apply 4 g topically 4 (four) times daily.  Dispense: 100 g; Refill: 1

## 2023-05-12 NOTE — Assessment & Plan Note (Signed)
 BP Readings from Last 3 Encounters:  05/12/23 128/73  10/24/19 136/84  07/31/19 (!) 142/90   Controlled on lisinopril 20 mg daily Continue current medications. No changes in management. Discussed DASH diet and dietary sodium restrictions Continue to increase dietary efforts and exercise.

## 2023-05-12 NOTE — Assessment & Plan Note (Signed)
Ambulatory referral to ENT.

## 2023-05-13 LAB — CMP14+EGFR
ALT: 44 IU/L (ref 0–44)
AST: 37 IU/L (ref 0–40)
Albumin: 4.8 g/dL (ref 3.8–4.9)
Alkaline Phosphatase: 108 IU/L (ref 44–121)
BUN/Creatinine Ratio: 8 — ABNORMAL LOW (ref 9–20)
BUN: 6 mg/dL (ref 6–24)
Bilirubin Total: 0.4 mg/dL (ref 0.0–1.2)
CO2: 26 mmol/L (ref 20–29)
Calcium: 10 mg/dL (ref 8.7–10.2)
Chloride: 103 mmol/L (ref 96–106)
Creatinine, Ser: 0.77 mg/dL (ref 0.76–1.27)
Globulin, Total: 2.6 g/dL (ref 1.5–4.5)
Glucose: 109 mg/dL — ABNORMAL HIGH (ref 70–99)
Potassium: 4.9 mmol/L (ref 3.5–5.2)
Sodium: 142 mmol/L (ref 134–144)
Total Protein: 7.4 g/dL (ref 6.0–8.5)
eGFR: 104 mL/min/{1.73_m2} (ref >59)

## 2023-05-13 LAB — CBC
Hematocrit: 47.8 % (ref 37.5–51.0)
Hemoglobin: 15.3 g/dL (ref 13.0–17.7)
MCH: 28.2 pg (ref 26.6–33.0)
MCHC: 32 g/dL (ref 31.5–35.7)
MCV: 88 fL (ref 79–97)
Platelets: 209 10*3/uL (ref 150–450)
RBC: 5.43 x10E6/uL (ref 4.14–5.80)
RDW: 13.2 % (ref 11.6–15.4)
WBC: 10.5 10*3/uL (ref 3.4–10.8)

## 2023-05-27 NOTE — Telephone Encounter (Signed)
 Lab results relayed to pt with interpreter on call.

## 2023-05-28 ENCOUNTER — Ambulatory Visit (INDEPENDENT_AMBULATORY_CARE_PROVIDER_SITE_OTHER): Admitting: Audiology

## 2023-05-28 DIAGNOSIS — H903 Sensorineural hearing loss, bilateral: Secondary | ICD-10-CM | POA: Diagnosis not present

## 2023-05-28 NOTE — Progress Notes (Signed)
  [address] [place], [place] [address] [phone]  Audiological Evaluation    Name: Cody Ross     DOB:   [birth year]      MRN:   [id number]                                                                                     Service Date: 05/28/2023     Accompanied by: son's friend [employer] was used during the appointment. Interpreter # [phone]   Patient comes after Burna Forts, PA-C sent a referral for a hearing evaluation due to concerns with hearing loss.   Symptoms Yes Details  Hearing loss  [x]  Decline in hearing in both ears  Tinnitus  []    Ear pain/ infections/pressure  []    Balance problems  []    Noise exposure history  []    Previous ear surgeries  []    Family history of hearing loss  []    Amplification  [x]  Has a set of hearing aids that he reports do not work.  Other  []      Otoscopy: Right ear: Clear external ear canals and notable landmarks visualized on the tympanic membrane. Left ear:  Clear external ear canals and notable landmarks visualized on the tympanic membrane.  Tympanometry: Right ear: Type A- Normal external ear canal volume with normal middle ear pressure and tympanic membrane compliance Left ear: Type A- Normal external ear canal volume with normal middle ear pressure and tympanic membrane compliance     Pure tone Audiometry: Both ears - Moderate to severe sensorineural hearing loss from 125 Hz - 8000 Hz.   Speech Audiometry: Could not test due to language barrier. Patient speaks Nepali.    The hearing test results were completed under headphones and re-checked with inserts and results are deemed to be of good to fair reliability for the data obtained. Test technique:  conventional    Recommendations: Follow up with ENT as scheduled for today. Return for a hearing evaluation if concerns with hearing changes arise or per MD recommendation. Recommend a hearing aid check with their audiologist or hearing aid  dispenser. It seems that he doe snot remember where he was fit with them and he was given options in our area.   Cody Ross MARIE LEROUX-MARTINEZ, AUD

## 2023-06-02 ENCOUNTER — Other Ambulatory Visit (INDEPENDENT_AMBULATORY_CARE_PROVIDER_SITE_OTHER): Payer: Self-pay | Admitting: Physician Assistant

## 2023-06-02 ENCOUNTER — Institutional Professional Consult (permissible substitution) (INDEPENDENT_AMBULATORY_CARE_PROVIDER_SITE_OTHER)

## 2023-06-07 ENCOUNTER — Institutional Professional Consult (permissible substitution) (INDEPENDENT_AMBULATORY_CARE_PROVIDER_SITE_OTHER)

## 2023-06-15 ENCOUNTER — Ambulatory Visit (INDEPENDENT_AMBULATORY_CARE_PROVIDER_SITE_OTHER): Admitting: Physician Assistant

## 2023-06-15 ENCOUNTER — Encounter (INDEPENDENT_AMBULATORY_CARE_PROVIDER_SITE_OTHER): Payer: Self-pay

## 2023-06-15 VITALS — BP 134/79 | HR 70 | Ht 62.0 in | Wt 174.0 lb

## 2023-06-15 DIAGNOSIS — H903 Sensorineural hearing loss, bilateral: Secondary | ICD-10-CM

## 2023-06-15 NOTE — Progress Notes (Signed)
 Dear Dr. Carlette Cheers, Here is my assessment for our mutual patient, Cody Ross. Thank you for allowing me the opportunity to care for your patient. Please do not hesitate to contact me should you have any other questions. Sincerely, Belma Boxer PA-C  Otolaryngology Clinic Note Referring provider: Dr. Carlette Cheers HPI:  Cody Ross is a 58 y.o. male kindly referred by Dr. Carlette Cheers   The patient is a 69 year old gentleman accompanied by a family friend who he requests interpret for him.  He notes that he had a history of hearing loss since a child, when he came to [place]  in [place] he was seen by ENT who recommended hearing aids.  He has been wearing them since that time.  He notes that he is having difficulty with the hearing aids as they do not hold a charge and he does not hear as well with them.  He reports that he received them from our practice on [address] in 2013.  He notes that his right sided hearing seems better when compared to the left.  He denies any dramatic or sudden changes to his hearing.  He denies any ear pain, no dizziness, he does report tinnitus bilateral left greater than right, no pulsatile tinnitus.  He notes his noise exposure history includes having a job with loud noises in [place].     Independent Review of Additional Tests or Records:  Audiological evaluation on 05/28/2023  Tympanometry: Right ear: Type A- Normal external ear canal volume with normal middle ear pressure and tympanic membrane compliance Left ear: Type A- Normal external ear canal volume with normal middle ear pressure and tympanic membrane compliance      Pure tone Audiometry: Both ears - Moderate to severe sensorineural hearing loss from 125 Hz - 8000 Hz.   Speech Audiometry: Could not test due to language barrier. Patient speaks Nepali.    The hearing test results were completed under headphones and re-checked with inserts and results are deemed to be of good to fair reliability for the data  obtained. Test technique:  conventional      Bilateral sensorineural hearing loss   PMH/Meds/All/SocHx/FamHx/ROS:   Past Medical History:  Diagnosis Date   Depression    Hypertension      History reviewed. No pertinent surgical history.  Family History  Problem Relation Age of Onset   Healthy Mother    Healthy Father      Social Connections: Not on file      Current Outpatient Medications:    acetaminophen  (TYLENOL ) 325 MG tablet, Take 2 tablets (650 mg total) by mouth every 6 (six) hours as needed., Disp: 30 tablet, Rfl: 0   cetirizine (ZYRTEC) 10 MG tablet, Take 10 mg by mouth every morning., Disp: , Rfl:    diclofenac  Sodium (VOLTAREN  ARTHRITIS PAIN) 1 % GEL, Apply 4 g topically 4 (four) times daily., Disp: 100 g, Rfl: 1   gabapentin (NEURONTIN) 300 MG capsule, Take 300 mg by mouth 3 (three) times daily., Disp: , Rfl:    indomethacin  (INDOCIN ) 50 MG capsule, Take 1 capsule (50 mg total) by mouth 2 (two) times daily with a meal., Disp: 30 capsule, Rfl: 0   lisinopril (ZESTRIL) 20 MG tablet, Take 20 mg by mouth daily., Disp: , Rfl:    sertraline (ZOLOFT) 100 MG tablet, Take 100 mg by mouth daily., Disp: , Rfl:    triamcinolone  cream (KENALOG ) 0.1 %, Apply topically 2 (two) times daily., Disp: , Rfl:    Vitamin D, Ergocalciferol, (DRISDOL)  1.25 MG (50000 UNIT) CAPS capsule, Take 50,000 Units by mouth once a week., Disp: , Rfl:    amitriptyline (ELAVIL) 50 MG tablet, Take 50 mg by mouth at bedtime. (Patient not taking: Reported on 06/15/2023), Disp: , Rfl:    butalbital -aspirin -caffeine  (FIORINAL ) 50-325-40 MG capsule, Take 2 capsules by mouth 2 (two) times daily as needed for headache. (Patient not taking: Reported on 06/15/2023), Disp: 14 capsule, Rfl: 0   Physical Exam:   BP 134/79 (BP Location: Right Arm, Cuff Size: Normal)   Pulse 70   Ht 5\' 2"  (1.575 m)   Wt 174 lb (78.9 kg)   BMI 31.83 kg/m   Pertinent Findings  CN II-XII intact  Bilateral EAC clear and TM  intact with well pneumatized middle ear spaces Weber 512: equal Rinne 512: AC > BC b/l  Anterior rhinoscopy: Septum midline; bilateral inferior turbinates with no hypertrophy No lesions of oral cavity/oropharynx; dentition in normal limits No obviously palpable neck masses/lymphadenopathy/thyromegaly No respiratory distress or stridor  Seprately Identifiable Procedures:  None  Impression & Plans:  Cody Ross is a 58 y.o. male with the following   Bilateral sensorineural hearing loss-  The patient presents with bilateral sensorineural hearing loss.  He reports that he received the hearing aids from our practice on [address].  I informed him that we are in a practice and that this is not possible.  I explained that the other practice on surgery was [facility].  Should them a picture of the building they said that this was the practice.  I am unable to find any documentation within the chart that he was seen by [facility] but admittedly there could have been a mistake with name date of birth and he may have 2 charts.  I advised that he may take his audiogram to any local provider and provided him a list that provide hearing aids but I would recommend going back to [facility] if that is indeed where he had the hearing aids for them to see if they are still viable.  I am happy to see him back in the office for any further questions or concerns he may have.   - f/u as needed   Thank you for allowing me the opportunity to care for your patient. Please do not hesitate to contact me should you have any other questions.  Sincerely, Belma Boxer PA-C [facility] Phone: [phone] Fax: [phone]  06/15/2023, 10:43 AM

## 2023-08-18 ENCOUNTER — Ambulatory Visit: Payer: Self-pay

## 2023-08-18 NOTE — Telephone Encounter (Signed)
 Please call [phone] for appt scheduling.  FYI Only or Action Required?: Action required by provider: request for appointment.  Patient was last seen in primary care on 05/12/2023 by Paseda, Folashade R, FNP. Called Nurse Triage reporting Pre-op Exam and Shoulder Pain. Symptoms began several years ago. Interventions attempted: Nothing. Symptoms are: unchanged.  Triage Disposition: See PCP When Office is Open (Within 3 Days)  Patient/caregiver understands and will follow disposition?: Yes, will follow disposition   Copied from CRM [id number]. Topic: Clinical - Red Word Triage >> Aug 18, 2023  1:04 PM Carla L wrote: Red Word that prompted transfer to Nurse Triage: pain in knee and shoulder Reason for Disposition  [1] MODERATE pain (e.g., interferes with normal activities) AND [2] present > 3 days  Answer Assessment - Initial Assessment Questions 1. ONSET: When did the pain start?     A long time 2. LOCATION: Where is the pain located?     L shoulder 3. PAIN: How bad is the pain? (Scale 1-10; or mild, moderate, severe)   - MILD (1-3): doesn't interfere with normal activities   - MODERATE (4-7): interferes with normal activities (e.g., work or school) or awakens from sleep   - SEVERE (8-10): excruciating pain, unable to do any normal activities, unable to move arm at all due to pain     moderate 5. CAUSE: What do you think is causing the shoulder pain?     Has had surgery on same shoulder in past 6. OTHER SYMPTOMS: Do you have any other symptoms? (e.g., neck pain, swelling, rash, fever, numbness, weakness) Had fever about 4 days ago, resolved with OTC  Protocols used: Shoulder Pain-A-AH

## 2023-08-19 NOTE — Telephone Encounter (Signed)
 Lvm with interpreter advising of the need for a call back to make an appt for pre op exam. [facility]

## 2023-09-13 ENCOUNTER — Ambulatory Visit (HOSPITAL_COMMUNITY): Payer: Self-pay

## 2023-11-16 ENCOUNTER — Ambulatory Visit: Payer: Self-pay | Admitting: Nurse Practitioner

## 2023-11-19 ENCOUNTER — Ambulatory Visit (INDEPENDENT_AMBULATORY_CARE_PROVIDER_SITE_OTHER): Payer: Self-pay | Admitting: Nurse Practitioner

## 2023-11-19 ENCOUNTER — Encounter: Payer: Self-pay | Admitting: Nurse Practitioner

## 2023-11-19 VITALS — BP 126/68 | HR 64 | Wt 173.0 lb

## 2023-11-19 DIAGNOSIS — F419 Anxiety disorder, unspecified: Secondary | ICD-10-CM

## 2023-11-19 DIAGNOSIS — H9193 Unspecified hearing loss, bilateral: Secondary | ICD-10-CM

## 2023-11-19 DIAGNOSIS — I10 Essential (primary) hypertension: Secondary | ICD-10-CM

## 2023-11-19 DIAGNOSIS — Z7409 Other reduced mobility: Secondary | ICD-10-CM

## 2023-11-19 DIAGNOSIS — R1310 Dysphagia, unspecified: Secondary | ICD-10-CM | POA: Diagnosis not present

## 2023-11-19 DIAGNOSIS — Z789 Other specified health status: Secondary | ICD-10-CM

## 2023-11-19 DIAGNOSIS — F32A Depression, unspecified: Secondary | ICD-10-CM

## 2023-11-19 DIAGNOSIS — E559 Vitamin D deficiency, unspecified: Secondary | ICD-10-CM

## 2023-11-19 DIAGNOSIS — M25511 Pain in right shoulder: Secondary | ICD-10-CM | POA: Diagnosis not present

## 2023-11-19 DIAGNOSIS — G8929 Other chronic pain: Secondary | ICD-10-CM

## 2023-11-19 DIAGNOSIS — M25561 Pain in right knee: Secondary | ICD-10-CM

## 2023-11-19 DIAGNOSIS — Z8739 Personal history of other diseases of the musculoskeletal system and connective tissue: Secondary | ICD-10-CM

## 2023-11-19 MED ORDER — ALLOPURINOL 100 MG PO TABS: 100.0000 mg | ORAL_TABLET | Freq: Every day | ORAL | 1 refills | Status: DC

## 2023-11-19 MED ORDER — LISINOPRIL 20 MG PO TABS: 20.0000 mg | ORAL_TABLET | Freq: Every day | ORAL | 1 refills | Status: DC

## 2023-11-19 NOTE — Patient Instructions (Addendum)
 Please take Tylenol  650 mg every 6 hours as needed for pain I encouraged use of heating pad and knee brace as needed Please follow-up with orthopedics regarding your right shoulder pain   1. Primary hypertension (Primary)  - lisinopril  (ZESTRIL ) 20 MG tablet; Take 1 tablet (20 mg total) by mouth daily.  Dispense: 90 tablet; Refill: 1 - Basic Metabolic Panel  2. Dysphagia, unspecified type  - Ambulatory referral to Gastroenterology  3. Chronic pain of right knee   4. Chronic right shoulder pain   5. History of gout  - Uric Acid - allopurinol  (ZYLOPRIM ) 100 MG tablet; Take 1 tablet (100 mg total) by mouth daily.  Dispense: 60 tablet; Refill: 1  6. Vitamin D deficiency  - VITAMIN D 25 Hydroxy (Vit-D Deficiency, Fractures)      It is important that you exercise regularly at least 30 minutes 5 times a week as tolerated  Think about what you will eat, plan ahead. Choose  clean, green, fresh or frozen over canned, processed or packaged foods which are more sugary, salty and fatty. 70 to 75% of food eaten should be vegetables and fruit. Three meals at set times with snacks allowed between meals, but they must be fruit or vegetables. Aim to eat over a 12 hour period , example 7 am to 7 pm, and STOP after  your last meal of the day. Drink water,generally about 64 ounces per day, no other drink is as healthy. Fruit juice is best enjoyed in a healthy way, by EATING the fruit.  Thanks for choosing [facility] we consider it a privelige to serve you.

## 2023-11-19 NOTE — Assessment & Plan Note (Signed)
 Right knee pain, status post surgery Chronic pain persists post-surgery, requiring cane for ambulation. - Follow up with orthopedics for knee pain management. - Ensure use of cane for ambulation. Continue diclofenac  sodium gel as needed, gabapentin 300 mg 3 times daily, Tylenol  as needed

## 2023-11-19 NOTE — Assessment & Plan Note (Signed)
 BP Readings from Last 3 Encounters:  11/19/23 126/68  06/15/23 134/79  05/12/23 128/73   Hypertension Blood pressure well-controlled at 126/68 mmHg on current medication regimen. - Continue lisinopril  20 mg daily

## 2023-11-19 NOTE — Assessment & Plan Note (Signed)
    11/19/2023   10:19 AM 05/12/2023    3:17 PM  GAD 7 : Generalized Anxiety Score  Nervous, Anxious, on Edge 0 0  Control/stop worrying 0 0  Worry too much - different things 0 0  Trouble relaxing 1 0  Restless 0 0  Easily annoyed or irritable 1 0  Afraid - awful might happen 0 0  Total GAD 7 Score 2 0  Anxiety Difficulty Not difficult at all Not difficult at all        11/19/2023   10:15 AM 05/12/2023    3:17 PM  Depression screen PHQ 2/9  Decreased Interest 1 0  Down, Depressed, Hopeless 1 0  PHQ - 2 Score 2 0  Altered sleeping 0   Tired, decreased energy 0   Change in appetite 0   Feeling bad or failure about yourself  0   Trouble concentrating 1   Moving slowly or fidgety/restless 1   Suicidal thoughts 0   PHQ-9 Score 4   Difficult doing work/chores Not difficult at all    Ongoing depression related to family issues and communication difficulties due to hearing loss. Sertraline was ineffective, and he declined restarting medication. - Offer referral for counseling and medication but the patient declined, unsure if he is taking amitriptyline

## 2023-11-19 NOTE — Assessment & Plan Note (Signed)
 On vitamin D 50,000 units once weekly Checking labs

## 2023-11-19 NOTE — Assessment & Plan Note (Signed)
 Gout  primarily affecting the big toe.  - Prescribe allopurinol  100 mg daily for gout prevention. - Use indomethacin  as needed for acute gout flares. - Educate on the use of indomethacin  only for acute flares.

## 2023-11-19 NOTE — Progress Notes (Addendum)
 Acute Office Visit  Subjective:     Patient ID: Cody Ross, male    DOB: 1966/02/12, 58 y.o.   MRN: [id number]  Chief Complaint  Patient presents with   Medical Management of Chronic Issues    HPI     Discussed the use of AI scribe software for clinical note transcription with the patient, who gave verbal consent to proceed.  History of Present Illness Cody Ross is a 58 year old male who presents with knee pain and other health concerns.  He is accompanied by his son's friend.  They declined the use of the medical interpreter today  He is experiencing persistent knee pain following knee surgery. He uses a cane for mobility. Current medications for pain include Tylenol  and Voltaren  gel, along with gabapentin 300 mg three times daily.  He has chronic right shoulder pain since his shoulder surgery three years ago. The pain is located under the arm and worsens with movement, such as lifting or squeezing. He has not recently discussed this with his orthopedic specialist.  He experiences hearing difficulties and requires people to speak loudly for him to hear. He has seen an ENT specialist in the past, who suggested a hearing aid. However, there have been issues with obtaining it through Medicaid.  He reports symptoms of dysphagia, including a sensation of choking on water and feeling as if something is stuck in his throat. These symptoms have been worsening over the past two months, affecting his ability to eat and drink.  He has a history of depression, which he attributes to family problems and communication difficulties due to his hearing loss. He previously took medication for depression but found it unhelpful and has not taken it for a long time. He has stopped taking sertraline for anxiety and depression.  He has a history of gout, with attacks typically affecting his big toe. He takes indomethacin  daily, med was been refilled by previous PCP,of note he has previously taking  allopurinol  for gout prevention but is not currently on it.  He lives with his son, although his son is not always present at home. He receives personal care services at home, need re certification . No fever, chills, chest pain, or shortness of breath.  Assessment & Plan    Review of Systems  Constitutional:  Negative for appetite change, chills, fatigue and fever.  HENT:  Positive for hearing loss and trouble swallowing. Negative for congestion, postnasal drip, rhinorrhea and sneezing.   Respiratory:  Negative for cough, shortness of breath and wheezing.   Cardiovascular:  Negative for chest pain, palpitations and leg swelling.  Gastrointestinal:  Negative for abdominal pain, constipation, nausea and vomiting.  Genitourinary:  Negative for difficulty urinating, dysuria, flank pain and frequency.  Musculoskeletal:  Positive for arthralgias. Negative for back pain, joint swelling and myalgias.  Skin:  Negative for color change, pallor, rash and wound.  Neurological:  Negative for facial asymmetry, weakness, numbness and headaches.  Psychiatric/Behavioral:  Negative for behavioral problems, confusion, self-injury and suicidal ideas.         Objective:    BP 126/68   Pulse 64   Wt 173 lb (78.5 kg)   SpO2 99%   BMI 31.64 kg/m    Physical Exam Vitals and nursing note reviewed.  Constitutional:      General: He is not in acute distress.    Appearance: Normal appearance. He is not ill-appearing, toxic-appearing or diaphoretic.  Eyes:     General: No scleral icterus.  Right eye: No discharge.        Left eye: No discharge.     Extraocular Movements: Extraocular movements intact.     Conjunctiva/sclera: Conjunctivae normal.  Cardiovascular:     Rate and Rhythm: Normal rate and regular rhythm.     Pulses: Normal pulses.     Heart sounds: Normal heart sounds. No murmur heard.    No friction rub. No gallop.  Pulmonary:     Effort: Pulmonary effort is normal. No respiratory  distress.     Breath sounds: Normal breath sounds. No stridor. No wheezing, rhonchi or rales.  Chest:     Chest wall: No tenderness.  Abdominal:     General: There is no distension.     Palpations: Abdomen is soft.     Tenderness: There is no abdominal tenderness. There is no right CVA tenderness, left CVA tenderness or guarding.  Musculoskeletal:        General: Tenderness present. No swelling, deformity or signs of injury.     Right lower leg: No edema.     Left lower leg: No edema.     Comments: Right knee tenderness, limited range of motion of right shoulder and tenderness on palpation Uses a cane for walking  Skin:    General: Skin is warm and dry.     Capillary Refill: Capillary refill takes less than 2 seconds.     Coloration: Skin is not jaundiced or pale.     Findings: No bruising, erythema or lesion.  Neurological:     Mental Status: He is alert and oriented to person, place, and time.     Motor: No weakness.     Gait: Gait abnormal.  Psychiatric:        Mood and Affect: Mood normal.        Behavior: Behavior normal.        Thought Content: Thought content normal.        Judgment: Judgment normal.     Results for orders placed or performed in visit on 11/19/23  VITAMIN D 25 Hydroxy (Vit-D Deficiency, Fractures)  Result Value Ref Range   Vit D, 25-Hydroxy 54.8 30.0 - 100.0 ng/mL  Uric Acid  Result Value Ref Range   Uric Acid 9.7 (H) 3.8 - 8.4 mg/dL  Basic Metabolic Panel  Result Value Ref Range   Glucose 92 70 - 99 mg/dL   BUN 10 6 - 24 mg/dL   Creatinine, Ser 9.09 0.76 - 1.27 mg/dL   eGFR 99 >40 fO/fpw/8.26   BUN/Creatinine Ratio 11 9 - 20   Sodium 139 134 - 144 mmol/L   Potassium 4.6 3.5 - 5.2 mmol/L   Chloride 104 96 - 106 mmol/L   CO2 21 20 - 29 mmol/L   Calcium 9.9 8.7 - 10.2 mg/dL  CBC  Result Value Ref Range   WBC 10.9 (H) 3.4 - 10.8 x10E3/uL   RBC 5.46 4.14 - 5.80 x10E6/uL   Hemoglobin 15.3 13.0 - 17.7 g/dL   Hematocrit 51.9 62.4 - 51.0 %   MCV  88 79 - 97 fL   MCH 28.0 26.6 - 33.0 pg   MCHC 31.9 31.5 - 35.7 g/dL   RDW 87.0 88.3 - 84.5 %   Platelets 228 150 - 450 x10E3/uL        Assessment & Plan:   Problem List Items Addressed This Visit       Cardiovascular and Mediastinum   High blood pressure - Primary   BP Readings from  Last 3 Encounters:  11/19/23 126/68  06/15/23 134/79  05/12/23 128/73   Hypertension Blood pressure well-controlled at 126/68 mmHg on current medication regimen. - Continue lisinopril  20 mg daily       Relevant Medications   lisinopril  (ZESTRIL ) 20 MG tablet   Other Relevant Orders   Basic Metabolic Panel (Completed)   CBC (Completed)     Digestive   Dysphagia   Dysphagia and choking with liquids and solids Dysphagia and choking sensation with liquids and solids for two months, with sensation of obstruction in throat. - Refer to GI specialist for evaluation of dysphagia. - Advise to chew food well and try soft foods.       Relevant Orders   Ambulatory referral to Gastroenterology     Other   Chronic pain of right knee   Right knee pain, status post surgery Chronic pain persists post-surgery, requiring cane for ambulation. - Follow up with orthopedics for knee pain management. - Ensure use of cane for ambulation. Continue diclofenac  sodium gel as needed, gabapentin 300 mg 3 times daily, Tylenol  as needed       Hearing impaired person, bilateral   Hearing loss Continued hearing loss necessitating loud speech. Previous ENT visit recommended hearing aid, but follow-up delayed due to insurance issues. They planned on following up with ENT        Chronic right shoulder pain   Right shoulder pain, status post surgery Chronic pain post-surgery, exacerbated by lifting and squeezing. Not yet discussed with orthopedics. - Discuss shoulder pain with orthopedics during next visit. Continue diclofenac  gel as needed, gabapentin 300 mg 3 times daily, Tylenol  as needed       History of  gout   Gout  primarily affecting the big toe.  - Prescribe allopurinol  100 mg daily for gout prevention. - Use indomethacin  as needed for acute gout flares. - Educate on the use of indomethacin  only for acute flares.       Relevant Medications   allopurinol  (ZYLOPRIM ) 100 MG tablet   Other Relevant Orders   Uric Acid (Completed)   CBC (Completed)   Vitamin D deficiency   On vitamin D 50,000 units once weekly Checking labs      Relevant Orders   VITAMIN D 25 Hydroxy (Vit-D Deficiency, Fractures) (Completed)   Anxiety and depression      11/19/2023   10:19 AM 05/12/2023    3:17 PM  GAD 7 : Generalized Anxiety Score  Nervous, Anxious, on Edge 0 0  Control/stop worrying 0 0  Worry too much - different things 0 0  Trouble relaxing 1 0  Restless 0 0  Easily annoyed or irritable 1 0  Afraid - awful might happen 0 0  Total GAD 7 Score 2 0  Anxiety Difficulty Not difficult at all Not difficult at all        11/19/2023   10:15 AM 05/12/2023    3:17 PM  Depression screen PHQ 2/9  Decreased Interest 1 0  Down, Depressed, Hopeless 1 0  PHQ - 2 Score 2 0  Altered sleeping 0   Tired, decreased energy 0   Change in appetite 0   Feeling bad or failure about yourself  0   Trouble concentrating 1   Moving slowly or fidgety/restless 1   Suicidal thoughts 0   PHQ-9 Score 4   Difficult doing work/chores Not difficult at all    Ongoing depression related to family issues and communication difficulties due to hearing loss. Sertraline was  ineffective, and he declined restarting medication. - Offer referral for counseling and medication but the patient declined, unsure if he is taking amitriptyline       Impaired mobility and ADLs   Needs assistance with ADLs, PCS re certification due , will complete form        Meds ordered this encounter  Medications   lisinopril  (ZESTRIL ) 20 MG tablet    Sig: Take 1 tablet (20 mg total) by mouth daily.    Dispense:  90 tablet    Refill:  1    allopurinol  (ZYLOPRIM ) 100 MG tablet    Sig: Take 1 tablet (100 mg total) by mouth daily.    Dispense:  60 tablet    Refill:  1    Return in about 3 months (around 02/18/2024) for HTN gout.  Travus Oren R Alyaan Budzynski, FNP

## 2023-11-19 NOTE — Assessment & Plan Note (Signed)
 Dysphagia and choking with liquids and solids Dysphagia and choking sensation with liquids and solids for two months, with sensation of obstruction in throat. - Refer to GI specialist for evaluation of dysphagia. - Advise to chew food well and try soft foods.

## 2023-11-19 NOTE — Assessment & Plan Note (Signed)
 Right shoulder pain, status post surgery Chronic pain post-surgery, exacerbated by lifting and squeezing. Not yet discussed with orthopedics. - Discuss shoulder pain with orthopedics during next visit. Continue diclofenac  gel as needed, gabapentin 300 mg 3 times daily, Tylenol  as needed

## 2023-11-19 NOTE — Assessment & Plan Note (Addendum)
 Hearing loss Continued hearing loss necessitating loud speech. Previous ENT visit recommended hearing aid, but follow-up delayed due to insurance issues. They planned on following up with ENT

## 2023-11-20 LAB — CBC
Hematocrit: 48 % (ref 37.5–51.0)
Hemoglobin: 15.3 g/dL (ref 13.0–17.7)
MCH: 28 pg (ref 26.6–33.0)
MCHC: 31.9 g/dL (ref 31.5–35.7)
MCV: 88 fL (ref 79–97)
Platelets: 228 x10E3/uL (ref 150–450)
RBC: 5.46 x10E6/uL (ref 4.14–5.80)
RDW: 12.9 % (ref 11.6–15.4)
WBC: 10.9 x10E3/uL — ABNORMAL HIGH (ref 3.4–10.8)

## 2023-11-20 LAB — BASIC METABOLIC PANEL WITH GFR
BUN/Creatinine Ratio: 11 (ref 9–20)
BUN: 10 mg/dL (ref 6–24)
CO2: 21 mmol/L (ref 20–29)
Calcium: 9.9 mg/dL (ref 8.7–10.2)
Chloride: 104 mmol/L (ref 96–106)
Creatinine, Ser: 0.9 mg/dL (ref 0.76–1.27)
Glucose: 92 mg/dL (ref 70–99)
Potassium: 4.6 mmol/L (ref 3.5–5.2)
Sodium: 139 mmol/L (ref 134–144)
eGFR: 99 mL/min/1.73 (ref >59)

## 2023-11-20 LAB — URIC ACID: Uric Acid: 9.7 mg/dL — ABNORMAL HIGH (ref 3.8–8.4)

## 2023-11-20 LAB — VITAMIN D 25 HYDROXY (VIT D DEFICIENCY, FRACTURES): Vit D, 25-Hydroxy: 54.8 ng/mL (ref 30.0–100.0)

## 2023-11-22 ENCOUNTER — Ambulatory Visit: Payer: Self-pay | Admitting: Nurse Practitioner

## 2023-11-23 DIAGNOSIS — Z7409 Other reduced mobility: Secondary | ICD-10-CM | POA: Insufficient documentation

## 2023-11-23 NOTE — Assessment & Plan Note (Signed)
 Needs assistance with ADLs, PCS re certification due , will complete form

## 2023-12-07 ENCOUNTER — Other Ambulatory Visit: Payer: Self-pay | Admitting: Nurse Practitioner

## 2023-12-07 DIAGNOSIS — I10 Essential (primary) hypertension: Secondary | ICD-10-CM

## 2023-12-07 DIAGNOSIS — Z8739 Personal history of other diseases of the musculoskeletal system and connective tissue: Secondary | ICD-10-CM

## 2023-12-07 MED ORDER — LISINOPRIL 20 MG PO TABS: 20.0000 mg | ORAL_TABLET | Freq: Every day | ORAL | 1 refills | Status: DC

## 2023-12-07 MED ORDER — ALLOPURINOL 100 MG PO TABS: 100.0000 mg | ORAL_TABLET | Freq: Every day | ORAL | 1 refills | Status: DC

## 2023-12-07 NOTE — Telephone Encounter (Unsigned)
 Copied from CRM #[id number]. Topic: Clinical - Medication Refill >> Dec 07, 2023  3:16 PM Emylou G wrote: Medication: allopurinol  (ZYLOPRIM ) 100 MG tablet lisinopril  (ZESTRIL ) 20 MG tablet   Has the patient contacted their pharmacy? Yes (Agent: If no, request that the patient contact the pharmacy for the refill. If patient does not wish to contact the pharmacy document the reason why and proceed with request.) (Agent: If yes, when and what did the pharmacy advise?) said to call us   This is the patient's preferred pharmacy:  [id number] [place], [place] - [address] AT [facility] [address] & [facility] [address] [place] [address] Phone: [phone] Fax: [phone]    Is this the correct pharmacy for this prescription? Yes If no, delete pharmacy and type the correct one.   Has the prescription been filled recently? No  Is the patient out of the medication? Yes  Has the patient been seen for an appointment in the last year OR does the patient have an upcoming appointment? Yes  Can we respond through MyChart? No  Agent: Please be advised that Rx refills may take up to 3 business days. We ask that you follow-up with your pharmacy.

## 2024-02-11 ENCOUNTER — Encounter: Payer: Self-pay | Admitting: Nurse Practitioner

## 2024-02-11 ENCOUNTER — Ambulatory Visit: Payer: Self-pay | Admitting: Nurse Practitioner

## 2024-02-11 ENCOUNTER — Other Ambulatory Visit: Payer: Self-pay | Admitting: Nurse Practitioner

## 2024-02-11 VITALS — BP 134/80 | Wt 184.0 lb

## 2024-02-11 DIAGNOSIS — I10 Essential (primary) hypertension: Secondary | ICD-10-CM | POA: Diagnosis not present

## 2024-02-11 DIAGNOSIS — G8929 Other chronic pain: Secondary | ICD-10-CM | POA: Diagnosis not present

## 2024-02-11 DIAGNOSIS — Z1211 Encounter for screening for malignant neoplasm of colon: Secondary | ICD-10-CM

## 2024-02-11 DIAGNOSIS — Z0000 Encounter for general adult medical examination without abnormal findings: Secondary | ICD-10-CM | POA: Diagnosis not present

## 2024-02-11 DIAGNOSIS — M25561 Pain in right knee: Secondary | ICD-10-CM | POA: Diagnosis not present

## 2024-02-11 DIAGNOSIS — Z8739 Personal history of other diseases of the musculoskeletal system and connective tissue: Secondary | ICD-10-CM

## 2024-02-11 DIAGNOSIS — Z23 Encounter for immunization: Secondary | ICD-10-CM

## 2024-02-11 MED ORDER — LISINOPRIL 20 MG PO TABS: 20.0000 mg | ORAL_TABLET | Freq: Every day | ORAL | 1 refills | Status: AC

## 2024-02-11 NOTE — Progress Notes (Signed)
 "  Established Patient Office Visit  Subjective:  Patient ID: Cody Ross, male    DOB: [birth year]  Age: 58 y.o. MRN: [id number]  CC:  Chief Complaint  Patient presents with   Follow-up    HPI   Discussed the use of AI scribe software for clinical note transcription with the patient, who gave verbal consent to proceed.  History of Present Illness Cody Ross is a 58 year old male  has a past medical history of Chronic pain of right knee (05/12/2023), Depression, Hearing impaired person, bilateral (05/12/2023), History of gout (11/19/2023), Hypertension, and Vitamin D  deficiency (11/19/2023).   who presents for a three-month follow-up.  He has been taking lisinopril  20 mg daily for hypertension management. Regarding his gout, he takes allopurinol  100mg  l daily and reports no recent gout attacks, though he experiences knee pain. He uses Voltaren  gel as needed for relief,also takes  gabapentin 300 mg three times daily and Tylenol  as needed.   He was previously prescribed amitriptyline, which he has not taken since March. He takes a daily multivitamin but not a specific vitamin D  supplement.  He saw an orthopedic doctor in November and follows a home exercise program. He also consulted an ENT doctor in April for hearing issues, accompanied by a friend. He was provided with a list of hearing aid providers but has not pursued it.  He moved from [place]  about a year ago after living there for two years, having originally come from [place].   He is accompanied by his son who assisted with interpretation, they declined the use of a medical interpreter today  Assessment & Plan      Past Medical History:  Diagnosis Date   Chronic pain of right knee 05/12/2023   Depression    Hearing impaired person, bilateral 05/12/2023   History of gout 11/19/2023   Hypertension    Vitamin D  deficiency 11/19/2023    History reviewed. No pertinent surgical history.  Family History   Problem Relation Age of Onset   Healthy Mother    Healthy Father     Social History   Socioeconomic History   Marital status: Married    Spouse name: Not on file   Number of children: Not on file   Years of education: Not on file   Highest education level: Not on file  Occupational History   Not on file  Tobacco Use   Smoking status: Never   Smokeless tobacco: Never  Vaping Use   Vaping status: Never Used  Substance and Sexual Activity   Alcohol use: Yes    Comment: occasionally   Drug use: No   Sexual activity: Yes    Birth control/protection: None    Comment: Married  Other Topics Concern   Not on file  Social History Narrative   Lives with his son    Social Drivers of Health   Tobacco Use: Low Risk (02/11/2024)   Patient History    Smoking Tobacco Use: Never    Smokeless Tobacco Use: Never    Passive Exposure: Not on file  Financial Resource Strain: Not on file  Food Insecurity: No Food Insecurity (02/11/2024)   Epic    Worried About Programme Researcher, Broadcasting/film/video in the Last Year: Never true    Ran Out of Food in the Last Year: Never true  Transportation Needs: No Transportation Needs (02/11/2024)   Epic    Lack of Transportation (Medical): No    Lack of Transportation (Non-Medical): No  Physical  Activity: Not on file  Stress: Not on file  Social Connections: Not on file  Intimate Partner Violence: Not At Risk (02/11/2024)   Epic    Fear of Current or Ex-Partner: No    Emotionally Abused: No    Physically Abused: No    Sexually Abused: No  Depression (PHQ2-9): Low Risk (02/11/2024)   Depression (PHQ2-9)    PHQ-2 Score: 0  Alcohol Screen: Not on file  Housing: Low Risk (02/11/2024)   Epic    Unable to Pay for Housing in the Last Year: No    Number of Times Moved in the Last Year: 0    Homeless in the Last Year: No  Utilities: Not At Risk (02/11/2024)   Epic    Threatened with loss of utilities: No  Health Literacy: Not on file    Outpatient  Medications Prior to Visit  Medication Sig Dispense Refill   acetaminophen  (TYLENOL ) 325 MG tablet Take 2 tablets (650 mg total) by mouth every 6 (six) hours as needed. 30 tablet 0   allopurinol  (ZYLOPRIM ) 100 MG tablet Take 1 tablet (100 mg total) by mouth daily. 60 tablet 1   cetirizine (ZYRTEC) 10 MG tablet Take 10 mg by mouth every morning.     diclofenac  Sodium (VOLTAREN  ARTHRITIS PAIN) 1 % GEL Apply 4 g topically 4 (four) times daily. 100 g 1   gabapentin (NEURONTIN) 300 MG capsule Take 300 mg by mouth 3 (three) times daily.     lisinopril  (ZESTRIL ) 20 MG tablet Take 1 tablet (20 mg total) by mouth daily. 90 tablet 1   triamcinolone  cream (KENALOG ) 0.1 % Apply topically 2 (two) times daily.     amitriptyline (ELAVIL) 50 MG tablet Take 50 mg by mouth at bedtime. (Patient not taking: Reported on 02/11/2024)     Vitamin D , Ergocalciferol , (DRISDOL) 1.25 MG (50000 UNIT) CAPS capsule Take 50,000 Units by mouth once a week. (Patient not taking: Reported on 02/11/2024)     No facility-administered medications prior to visit.    Allergies[1]  ROS Review of Systems  Constitutional:  Negative for appetite change, chills, fatigue and fever.  HENT:  Negative for congestion, postnasal drip, rhinorrhea and sneezing.   Respiratory:  Negative for cough, shortness of breath and wheezing.   Cardiovascular:  Negative for chest pain, palpitations and leg swelling.  Gastrointestinal:  Negative for abdominal pain, constipation, nausea and vomiting.  Genitourinary:  Negative for difficulty urinating, dysuria, flank pain and frequency.  Musculoskeletal:  Positive for arthralgias. Negative for joint swelling and myalgias.  Skin:  Negative for color change, pallor, rash and wound.  Neurological:  Negative for dizziness, facial asymmetry, weakness, numbness and headaches.  Psychiatric/Behavioral:  Negative for behavioral problems, confusion, self-injury and suicidal ideas.       Objective:    Physical  Exam Vitals and nursing note reviewed.  Constitutional:      General: He is not in acute distress.    Appearance: Normal appearance. He is not ill-appearing, toxic-appearing or diaphoretic.  HENT:     Ears:     Comments: Impaired hearing Eyes:     General: No scleral icterus.       Right eye: No discharge.        Left eye: No discharge.     Extraocular Movements: Extraocular movements intact.     Conjunctiva/sclera: Conjunctivae normal.  Cardiovascular:     Rate and Rhythm: Normal rate and regular rhythm.     Pulses: Normal pulses.  Heart sounds: Normal heart sounds. No murmur heard.    No friction rub. No gallop.  Pulmonary:     Effort: Pulmonary effort is normal. No respiratory distress.     Breath sounds: Normal breath sounds. No stridor. No wheezing, rhonchi or rales.  Chest:     Chest wall: No tenderness.  Abdominal:     General: There is no distension.     Palpations: Abdomen is soft.     Tenderness: There is no abdominal tenderness. There is no right CVA tenderness, left CVA tenderness or guarding.  Musculoskeletal:        General: Tenderness present. No swelling, deformity or signs of injury.     Right lower leg: No edema.     Left lower leg: No edema.     Comments: Chronic right knee pain.  Uses a cane for ambulation  Skin:    General: Skin is warm and dry.     Capillary Refill: Capillary refill takes less than 2 seconds.     Coloration: Skin is not jaundiced or pale.     Findings: No bruising, erythema or lesion.  Neurological:     Mental Status: He is alert and oriented to person, place, and time.     Motor: No weakness.     Gait: Gait abnormal.  Psychiatric:        Mood and Affect: Mood normal.        Behavior: Behavior normal.        Thought Content: Thought content normal.        Judgment: Judgment normal.     BP 134/80   Wt 184 lb (83.5 kg)   SpO2 99%   BMI 33.65 kg/m  Wt Readings from Last 3 Encounters:  02/11/24 184 lb (83.5 kg)  11/19/23  173 lb (78.5 kg)  06/15/23 174 lb (78.9 kg)    No results found for: TSH Lab Results  Component Value Date   WBC 10.9 (H) 11/19/2023   HGB 15.3 11/19/2023   HCT 48.0 11/19/2023   MCV 88 11/19/2023   PLT 228 11/19/2023   Lab Results  Component Value Date   NA 139 11/19/2023   K 4.6 11/19/2023   CO2 21 11/19/2023   GLUCOSE 92 11/19/2023   BUN 10 11/19/2023   CREATININE 0.90 11/19/2023   BILITOT 0.4 05/12/2023   ALKPHOS 108 05/12/2023   AST 37 05/12/2023   ALT 44 05/12/2023   PROT 7.4 05/12/2023   ALBUMIN 4.8 05/12/2023   CALCIUM 9.9 11/19/2023   ANIONGAP 13 12/20/2018   EGFR 99 11/19/2023   Lab Results  Component Value Date   CHOL 184 04/08/2020   Lab Results  Component Value Date   HDL 64 04/08/2020   Lab Results  Component Value Date   LDLCALC 83 04/08/2020   Lab Results  Component Value Date   TRIG 278 (H) 04/08/2020   Lab Results  Component Value Date   CHOLHDL 2.9 04/08/2020   No results found for: HGBA1C    Assessment & Plan:   Problem List Items Addressed This Visit       Cardiovascular and Mediastinum   High blood pressure   BP Readings from Last 3 Encounters:  02/11/24 134/80  11/19/23 126/68  06/15/23 134/79   Blood pressure controlled at 134/80 mmHg with current medication. - Continue lisinopril  20 mg daily. - Encouraged heart-healthy, low-salt, low-fat diet. - Encouraged moderate exercise, 30 minutes five days a week.  Other   Chronic pain of right knee   - Continue Voltaren  gel , gabapentin 300 mg 3 times daily, Tylenol  650 mg every 6 hours as needed  needed for knee pain.       History of gout   Allopurinol  taken daily. Uric acid levels to be checked for dosage effectiveness. - Checked uric acid level. - Continue allopurinol  100mg  daily. - Consider increasing allopurinol  dosage if uric acid levels remain high.       Relevant Orders   Uric Acid   Health care maintenance    Colon cancer screening status  unknown. Discussed shingles and pneumonia vaccines for individuals over 50. - Ordered Cologuard test for colon cancer screening. - Encouraged shingles vaccine if not previously received. - Encouraged pneumonia vaccine if not previously received.      Other Visit Diagnoses       Need for influenza vaccination    -  Primary   Relevant Orders   Flu vaccine trivalent PF, 6mos and older(Flulaval,Afluria,Fluarix,Fluzone) (Completed)     Screening for colon cancer       Relevant Orders   Cologuard       No orders of the defined types were placed in this encounter.   Follow-up: Return in about 4 months (around 06/11/2024) for HTN.    Nancye Grumbine R Ethelyn Cerniglia, FNP     [1] No Known Allergies  "

## 2024-02-11 NOTE — Assessment & Plan Note (Signed)
" °  Colon cancer screening status unknown. Discussed shingles and pneumonia vaccines for individuals over 50. - Ordered Cologuard test for colon cancer screening. - Encouraged shingles vaccine if not previously received. - Encouraged pneumonia vaccine if not previously received. "

## 2024-02-11 NOTE — Assessment & Plan Note (Signed)
-   Continue Voltaren  gel , gabapentin 300 mg 3 times daily, Tylenol  650 mg every 6 hours as needed  needed for knee pain.

## 2024-02-11 NOTE — Assessment & Plan Note (Signed)
 BP Readings from Last 3 Encounters:  02/11/24 134/80  11/19/23 126/68  06/15/23 134/79   Blood pressure controlled at 134/80 mmHg with current medication. - Continue lisinopril  20 mg daily. - Encouraged heart-healthy, low-salt, low-fat diet. - Encouraged moderate exercise, 30 minutes five days a week.

## 2024-02-11 NOTE — Assessment & Plan Note (Addendum)
 Allopurinol  taken daily. Uric acid levels to be checked for dosage effectiveness. - Checked uric acid level. - Continue allopurinol  100mg  daily. - Consider increasing allopurinol  dosage if uric acid levels remain high.

## 2024-02-11 NOTE — Patient Instructions (Addendum)
 Please consider getting Shingrix , TDAP and Tdap vaccine at local pharmacy.    It is important that you exercise regularly at least 30 minutes 5 times a week as tolerated  Think about what you will eat, plan ahead. Choose  clean, green, fresh or frozen over canned, processed or packaged foods which are more sugary, salty and fatty. 70 to 75% of food eaten should be vegetables and fruit. Three meals at set times with snacks allowed between meals, but they must be fruit or vegetables. Aim to eat over a 12 hour period , example 7 am to 7 pm, and STOP after  your last meal of the day. Drink water,generally about 64 ounces per day, no other drink is as healthy. Fruit juice is best enjoyed in a healthy way, by EATING the fruit.  Thanks for choosing [facility] we consider it a privelige to serve you.

## 2024-02-12 LAB — URIC ACID: Uric Acid: 6.9 mg/dL (ref 3.8–8.4)

## 2024-02-14 ENCOUNTER — Ambulatory Visit: Payer: Self-pay | Admitting: Nurse Practitioner

## 2024-02-14 ENCOUNTER — Other Ambulatory Visit: Payer: Self-pay | Admitting: Nurse Practitioner

## 2024-02-14 DIAGNOSIS — Z8739 Personal history of other diseases of the musculoskeletal system and connective tissue: Secondary | ICD-10-CM

## 2024-02-14 MED ORDER — ALLOPURINOL 100 MG PO TABS: 100.0000 mg | ORAL_TABLET | Freq: Every day | ORAL | 2 refills | Status: AC

## 2024-02-18 ENCOUNTER — Ambulatory Visit: Payer: Self-pay | Admitting: Nurse Practitioner

## 2024-06-12 ENCOUNTER — Ambulatory Visit: Payer: Self-pay | Admitting: Nurse Practitioner
